# Patient Record
Sex: Female | Born: 1945 | Race: Black or African American | Hispanic: No | Marital: Married | Smoking: Former smoker
Health system: Southern US, Community
[De-identification: ages and names within clinical notes are randomized; demographics above are authoritative.]

## PROBLEM LIST (undated history)

## (undated) DIAGNOSIS — I10 Essential (primary) hypertension: Secondary | ICD-10-CM

## (undated) DIAGNOSIS — M1990 Unspecified osteoarthritis, unspecified site: Secondary | ICD-10-CM

## (undated) DIAGNOSIS — E785 Hyperlipidemia, unspecified: Secondary | ICD-10-CM

## (undated) DIAGNOSIS — C801 Malignant (primary) neoplasm, unspecified: Secondary | ICD-10-CM

## (undated) DIAGNOSIS — E079 Disorder of thyroid, unspecified: Secondary | ICD-10-CM

## (undated) HISTORY — DX: Hyperlipidemia, unspecified: E78.5

## (undated) HISTORY — DX: Disorder of thyroid, unspecified: E07.9

## (undated) HISTORY — DX: Malignant (primary) neoplasm, unspecified: C80.1

## (undated) HISTORY — DX: Essential (primary) hypertension: I10

---

## 1998-09-01 ENCOUNTER — Other Ambulatory Visit: Admission: RE | Admit: 1998-09-01 | Discharge: 1998-09-01 | Payer: Self-pay | Admitting: Family Medicine

## 1999-09-01 ENCOUNTER — Other Ambulatory Visit: Admission: RE | Admit: 1999-09-01 | Discharge: 1999-09-01 | Payer: Self-pay | Admitting: Family Medicine

## 2001-01-25 ENCOUNTER — Other Ambulatory Visit: Admission: RE | Admit: 2001-01-25 | Discharge: 2001-01-25 | Payer: Self-pay | Admitting: Family Medicine

## 2002-02-26 ENCOUNTER — Other Ambulatory Visit: Admission: RE | Admit: 2002-02-26 | Discharge: 2002-02-26 | Payer: Self-pay | Admitting: Family Medicine

## 2003-10-10 ENCOUNTER — Other Ambulatory Visit: Admission: RE | Admit: 2003-10-10 | Discharge: 2003-10-10 | Payer: Self-pay | Admitting: Family Medicine

## 2005-05-26 ENCOUNTER — Other Ambulatory Visit: Admission: RE | Admit: 2005-05-26 | Discharge: 2005-05-26 | Payer: Self-pay | Admitting: Family Medicine

## 2005-07-13 ENCOUNTER — Encounter (INDEPENDENT_AMBULATORY_CARE_PROVIDER_SITE_OTHER): Payer: Self-pay | Admitting: Diagnostic Radiology

## 2005-07-13 ENCOUNTER — Encounter (INDEPENDENT_AMBULATORY_CARE_PROVIDER_SITE_OTHER): Payer: Self-pay | Admitting: *Deleted

## 2005-07-13 ENCOUNTER — Encounter: Admission: RE | Admit: 2005-07-13 | Discharge: 2005-07-13 | Payer: Self-pay | Admitting: General Surgery

## 2005-07-25 ENCOUNTER — Encounter: Admission: RE | Admit: 2005-07-25 | Discharge: 2005-07-25 | Payer: Self-pay | Admitting: General Surgery

## 2005-07-26 ENCOUNTER — Ambulatory Visit (HOSPITAL_BASED_OUTPATIENT_CLINIC_OR_DEPARTMENT_OTHER): Admission: RE | Admit: 2005-07-26 | Discharge: 2005-07-26 | Payer: Self-pay | Admitting: General Surgery

## 2005-07-26 ENCOUNTER — Encounter: Admission: RE | Admit: 2005-07-26 | Discharge: 2005-07-26 | Payer: Self-pay | Admitting: General Surgery

## 2005-07-26 ENCOUNTER — Encounter (INDEPENDENT_AMBULATORY_CARE_PROVIDER_SITE_OTHER): Payer: Self-pay | Admitting: Specialist

## 2005-07-26 ENCOUNTER — Ambulatory Visit: Payer: Self-pay | Admitting: Oncology

## 2005-07-26 ENCOUNTER — Encounter (INDEPENDENT_AMBULATORY_CARE_PROVIDER_SITE_OTHER): Payer: Self-pay | Admitting: General Surgery

## 2005-08-24 LAB — CBC WITH DIFFERENTIAL/PLATELET
Basophils Absolute: 0 10*3/uL (ref 0.0–0.1)
EOS%: 1.5 % (ref 0.0–7.0)
Eosinophils Absolute: 0.1 10*3/uL (ref 0.0–0.5)
LYMPH%: 31 % (ref 14.0–48.0)
MCH: 31.4 pg (ref 26.0–34.0)
MCV: 94.6 fL (ref 81.0–101.0)
MONO%: 9 % (ref 0.0–13.0)
Platelets: 229 10*3/uL (ref 145–400)
RBC: 4.12 10*6/uL (ref 3.70–5.32)
RDW: 15.9 % — ABNORMAL HIGH (ref 11.3–14.5)

## 2005-08-24 LAB — COMPREHENSIVE METABOLIC PANEL
AST: 28 U/L (ref 0–37)
Albumin: 4.7 g/dL (ref 3.5–5.2)
Alkaline Phosphatase: 78 U/L (ref 39–117)
BUN: 9 mg/dL (ref 6–23)
Glucose, Bld: 97 mg/dL (ref 70–99)
Potassium: 4.2 mEq/L (ref 3.5–5.3)
Sodium: 140 mEq/L (ref 135–145)
Total Bilirubin: 0.4 mg/dL (ref 0.3–1.2)

## 2005-08-24 LAB — LACTATE DEHYDROGENASE: LDH: 160 U/L (ref 94–250)

## 2005-09-12 ENCOUNTER — Ambulatory Visit: Admission: RE | Admit: 2005-09-12 | Discharge: 2005-09-26 | Payer: Self-pay | Admitting: Radiation Oncology

## 2005-10-05 ENCOUNTER — Ambulatory Visit: Payer: Self-pay | Admitting: Oncology

## 2006-06-20 ENCOUNTER — Other Ambulatory Visit: Admission: RE | Admit: 2006-06-20 | Discharge: 2006-06-20 | Payer: Self-pay | Admitting: Family Medicine

## 2009-01-06 ENCOUNTER — Encounter: Admission: RE | Admit: 2009-01-06 | Discharge: 2009-01-06 | Payer: Self-pay | Admitting: Surgery

## 2009-01-08 ENCOUNTER — Encounter: Admission: RE | Admit: 2009-01-08 | Discharge: 2009-01-08 | Payer: Self-pay | Admitting: Surgery

## 2009-01-08 ENCOUNTER — Ambulatory Visit (HOSPITAL_BASED_OUTPATIENT_CLINIC_OR_DEPARTMENT_OTHER): Admission: RE | Admit: 2009-01-08 | Discharge: 2009-01-08 | Payer: Self-pay | Admitting: Surgery

## 2009-01-08 ENCOUNTER — Encounter (INDEPENDENT_AMBULATORY_CARE_PROVIDER_SITE_OTHER): Payer: Self-pay | Admitting: Surgery

## 2010-01-14 DIAGNOSIS — C801 Malignant (primary) neoplasm, unspecified: Secondary | ICD-10-CM

## 2010-01-14 HISTORY — DX: Malignant (primary) neoplasm, unspecified: C80.1

## 2010-02-03 ENCOUNTER — Ambulatory Visit: Payer: Self-pay | Admitting: Oncology

## 2010-02-10 ENCOUNTER — Encounter: Admission: RE | Admit: 2010-02-10 | Discharge: 2010-02-10 | Payer: Self-pay | Admitting: Surgery

## 2010-02-10 ENCOUNTER — Ambulatory Visit: Admission: RE | Admit: 2010-02-10 | Discharge: 2010-03-02 | Payer: Self-pay | Admitting: Radiation Oncology

## 2010-02-10 LAB — CBC WITH DIFFERENTIAL/PLATELET
Eosinophils Absolute: 0.1 10*3/uL (ref 0.0–0.5)
HCT: 37.9 % (ref 34.8–46.6)
HGB: 12.7 g/dL (ref 11.6–15.9)
LYMPH%: 36.4 % (ref 14.0–49.7)
MONO#: 0.4 10*3/uL (ref 0.1–0.9)
NEUT#: 2.8 10*3/uL (ref 1.5–6.5)
NEUT%: 52.2 % (ref 38.4–76.8)
Platelets: 237 10*3/uL (ref 145–400)
WBC: 5.4 10*3/uL (ref 3.9–10.3)

## 2010-02-10 LAB — COMPREHENSIVE METABOLIC PANEL
ALT: 10 U/L (ref 0–35)
Albumin: 4.2 g/dL (ref 3.5–5.2)
BUN: 5 mg/dL — ABNORMAL LOW (ref 6–23)
CO2: 23 mEq/L (ref 19–32)
Calcium: 9.7 mg/dL (ref 8.4–10.5)
Chloride: 108 mEq/L (ref 96–112)
Creatinine, Ser: 0.72 mg/dL (ref 0.40–1.20)
Potassium: 4.3 mEq/L (ref 3.5–5.3)

## 2010-03-16 ENCOUNTER — Ambulatory Visit (HOSPITAL_COMMUNITY): Admission: RE | Admit: 2010-03-16 | Discharge: 2010-03-16 | Payer: Self-pay | Admitting: Surgery

## 2010-03-16 ENCOUNTER — Encounter: Admission: RE | Admit: 2010-03-16 | Discharge: 2010-03-16 | Payer: Self-pay | Admitting: Surgery

## 2010-03-22 ENCOUNTER — Encounter (INDEPENDENT_AMBULATORY_CARE_PROVIDER_SITE_OTHER): Payer: Self-pay | Admitting: Surgery

## 2010-03-22 ENCOUNTER — Ambulatory Visit (HOSPITAL_COMMUNITY): Admission: RE | Admit: 2010-03-22 | Discharge: 2010-03-23 | Payer: Self-pay | Admitting: Surgery

## 2010-03-22 HISTORY — PX: MASTECTOMY, PARTIAL: SHX709

## 2010-06-05 ENCOUNTER — Encounter: Payer: Self-pay | Admitting: Surgery

## 2010-07-28 LAB — SURGICAL PCR SCREEN: MRSA, PCR: NEGATIVE

## 2010-07-28 LAB — CBC
MCV: 86.3 fL (ref 78.0–100.0)
Platelets: 215 10*3/uL (ref 150–400)
RBC: 4.52 MIL/uL (ref 3.87–5.11)
WBC: 5.4 10*3/uL (ref 4.0–10.5)

## 2010-07-28 LAB — BASIC METABOLIC PANEL
Chloride: 110 mEq/L (ref 96–112)
Creatinine, Ser: 0.71 mg/dL (ref 0.4–1.2)
GFR calc Af Amer: >60 mL/min (ref >60)
GFR calc non Af Amer: >60 mL/min (ref >60)
Potassium: 4.6 mEq/L (ref 3.5–5.1)

## 2010-08-21 LAB — DIFFERENTIAL
Lymphocytes Relative: 22 % (ref 12–46)
Lymphs Abs: 1.7 10*3/uL (ref 0.7–4.0)
Monocytes Absolute: 0.5 10*3/uL (ref 0.1–1.0)
Monocytes Relative: 7 % (ref 3–12)
Neutro Abs: 5.2 10*3/uL (ref 1.7–7.7)
Neutrophils Relative %: 69 % (ref 43–77)

## 2010-08-21 LAB — BASIC METABOLIC PANEL
CO2: 27 mEq/L (ref 19–32)
Calcium: 10.2 mg/dL (ref 8.4–10.5)
Creatinine, Ser: 0.53 mg/dL (ref 0.4–1.2)
GFR calc Af Amer: >60 mL/min (ref >60)
GFR calc non Af Amer: >60 mL/min (ref >60)
Sodium: 144 mEq/L (ref 135–145)

## 2010-08-21 LAB — CBC
Hemoglobin: 14.1 g/dL (ref 12.0–15.0)
RBC: 4.2 MIL/uL (ref 3.87–5.11)
WBC: 7.6 10*3/uL (ref 4.0–10.5)

## 2010-09-28 NOTE — Op Note (Signed)
Paula, Alvarez NO.:  [id number]   MEDICAL RECORD NO.:  [id number]          PATIENT TYPE:  AMB   LOCATION:  DSC                          FACILITY:  [facility]   PHYSICIAN:  Thomas A. Cornett, M.D.DATE OF BIRTH:  Oct 14, 1945   DATE OF PROCEDURE:  01/08/2009  DATE OF DISCHARGE:                               OPERATIVE REPORT   PREOPERATIVE DIAGNOSIS:  Left breast cancer stage I.   POSTOPERATIVE DIAGNOSIS:  Left breast cancer stage I.   PROCEDURE:  Left breast needle localized partial mastectomy.   SURGEON:  Maisie Fus A. Cornett, MD   ANESTHESIA:  Local.  Approximately 40 mL of 0.25% Sensorcaine with  epinephrine was used.   ESTIMATED BLOOD LOSS:  5 mL.   SPECIMEN:  Left breast tissue with localizing wire and clip to  pathology, verified by radiograph.   INDICATIONS FOR PROCEDURE:  The patient is a 65 year old female with  recurrent left breast cancer.  She was treated with lumpectomy with  sentinel lymph node mapping about 2 years ago and has recurred.  I  talked with  her about repeat lumpectomy and postop radiation therapy  with potential sentinel lymph node mapping versus axillary node  dissection.  She did not wish any further therapy at the time of her  initial cancer and refused any further chemotherapy, radiation therapy  at that time.  I explained to her that her risk of recurrence was high  without proper therapy, but she was adamant about only having lumpectomy  done and those were her wishes after lengthy discussion.  I talked with  her about this and she was very adamant, did not wish any further  therapy except lumpectomy.  She presents for that today.   DESCRIPTION OF PROCEDURE:  After undergoing wire localization, the  patient was taken back to the operating room.  Unfortunately, the  patient has not been drink about 4 hours prior to the procedure;  therefore, a straight local was used.  The left breast was then prepped  and draped in sterile  fashion.  Wire was trimmed from the needle  localization into her left breast.  I used about 40 mL of 0.25%  Sensorcaine, injected it all around the mass.  An elliptical incision  was used in the left breast upper outer quadrant.  Dissection was  carried down around the wire.  The tumor was easily palpable, was in the  lateral portion of her previous lumpectomy cavity looked like.  I  excised this with cautery.  I excised this with scalpel.  The entire  tumor was removed.  I took an additional medial margin.  The cavity was  hemostatic after irrigation.  It was closed at deep layer of 3-0 Vicryl  and subsequent 4-0 Monocryl  stitch.  Dermabond was applied.  All final counts of sponge, needle, and  instruments were found to be correct for this portion of the case.  The  patient was awoke, taken to recovery in satisfactory condition.  All  final counts were correct.      Thomas A. Cornett, M.D.  Electronically Signed  TAC/MEDQ  D:  01/08/2009  T:  01/09/2009  Job:  [id number]   cc:   Dr. Bennie Pierini

## 2010-10-01 NOTE — Op Note (Signed)
NAMEHINLEY, BRIMAGE NO.:  [id number]   MEDICAL RECORD NO.:  [id number]          PATIENT TYPE:  AMB   LOCATION:  DSC                          FACILITY:  [facility]   PHYSICIAN:  Rose Phi. Maple Hudson, M.D.   DATE OF BIRTH:  [birth year]   DATE OF PROCEDURE:  07/26/2005  DATE OF DISCHARGE:                                 OPERATIVE REPORT   PREOPERATIVE DIAGNOSIS:  Stage I carcinoma of the left breast.   POSTOPERATIVE DIAGNOSIS:  Stage I carcinoma of the left breast.   OPERATION PERFORMED:  Blue dye injection, left partial mastectomy with  needle localization and specimen mammogram and left axillary sentinel lymph  node biopsy.   SURGEON:  Rose Phi. Maple Hudson, M.D.   ANESTHESIA:  General.   DESCRIPTION OF PROCEDURE:  Prior to coming to the operating room, a  localizing wire had been placed in the left breast for the lesion that was  at about the 12 o'clock position of the left breast.  In addition 1 mCi of  technetium sulfur colloid was injected intradermally.   After suitable general anesthesia was induced, the patient was placed in  supine position with the arms extended on the arm board.  5 mL of a mixture  of 2 mL of methylene blue and 3 mL of injectable saline was injected in the  subareolar breast tissue and the breast gently massaged for three minutes.  We then prepped and draped the breast and axilla.   A curved incision was then outlined for the lesion at the near 12 o'clock  position of the left breast using the previously placed wire as a guide.  A  curved incision was then made and a wide incision of the wire and  surrounding tissue was carried out.  The specimen was oriented for the  pathologist.  Specimen mammogram confirmed the removal of the lesion with  good margins around it and it was submitted to the pathologist for Touch  Prep evaluation of the margins.   While that was being done, a short transverse left axillary incision was  made with dissection  through the subcutaneous tissue to the clavipectoral  fascia.  Deep to the fascia, we found two blue and hot lymph nodes which  were removed and submitted to the pathologist for evaluation.  There were no  other blue, palpable or hot nodes.   The margins were reported as clean and the sentinel node turned out to be  negative.  Both incisions were thoroughly infiltrated with local anesthetic  mixture.  The lumpectomy incision was closed with deeper sutures to  obliterate the space and then close the skin with subcuticular 4-0 Monocryl  and Steri-Strips.  This gave an excellent cosmetic  appearance.  The sentinel node incision was closed with 3-0 Vicryl and  subcuticular 4-0 Monocryl and Steri-Strips.  Dressings were applied.  The  patient was then transferred to the recovery room in satisfactory condition  having tolerated the procedure well.      Rose Phi. Maple Hudson, M.D.  Electronically Signed     PRY/MEDQ  D:  07/26/2005  T:  07/27/2005  Job:  [id number]

## 2010-10-22 ENCOUNTER — Other Ambulatory Visit (INDEPENDENT_AMBULATORY_CARE_PROVIDER_SITE_OTHER): Payer: Self-pay | Admitting: Surgery

## 2010-10-22 DIAGNOSIS — R222 Localized swelling, mass and lump, trunk: Secondary | ICD-10-CM

## 2010-10-22 DIAGNOSIS — N6489 Other specified disorders of breast: Secondary | ICD-10-CM

## 2010-10-22 DIAGNOSIS — Z9012 Acquired absence of left breast and nipple: Secondary | ICD-10-CM

## 2010-11-03 ENCOUNTER — Other Ambulatory Visit: Payer: Self-pay

## 2010-11-03 ENCOUNTER — Ambulatory Visit
Admission: RE | Admit: 2010-11-03 | Discharge: 2010-11-03 | Disposition: A | Discharge status: Home / Self Care | Payer: Medicare Other | Source: Ambulatory Visit | Attending: Surgery | Admitting: Surgery

## 2010-11-03 DIAGNOSIS — N6489 Other specified disorders of breast: Secondary | ICD-10-CM

## 2010-11-03 DIAGNOSIS — R222 Localized swelling, mass and lump, trunk: Secondary | ICD-10-CM

## 2010-11-03 DIAGNOSIS — Z9012 Acquired absence of left breast and nipple: Secondary | ICD-10-CM

## 2010-11-03 MED ORDER — GADOBENATE DIMEGLUMINE 529 MG/ML IV SOLN
13.0000 mL | Freq: Once | INTRAVENOUS | Status: AC | PRN
  Administered 2010-11-03: 13 mL via INTRAVENOUS

## 2010-11-19 ENCOUNTER — Encounter (INDEPENDENT_AMBULATORY_CARE_PROVIDER_SITE_OTHER): Payer: Medicare Other | Admitting: Surgery

## 2011-03-14 ENCOUNTER — Ambulatory Visit (INDEPENDENT_AMBULATORY_CARE_PROVIDER_SITE_OTHER): Payer: Medicare Other | Admitting: Surgery

## 2011-03-25 ENCOUNTER — Encounter (INDEPENDENT_AMBULATORY_CARE_PROVIDER_SITE_OTHER): Payer: Self-pay | Admitting: Surgery

## 2011-03-25 ENCOUNTER — Ambulatory Visit (INDEPENDENT_AMBULATORY_CARE_PROVIDER_SITE_OTHER): Payer: Medicare Other | Admitting: Surgery

## 2011-03-25 VITALS — BP 134/72 | HR 56 | Temp 97.4°F | Resp 12 | Ht 63.5 in | Wt 145.2 lb

## 2011-03-25 DIAGNOSIS — Z853 Personal history of malignant neoplasm of breast: Secondary | ICD-10-CM

## 2011-03-25 NOTE — Patient Instructions (Signed)
Follow up in 1 year.  Keep follow up with doctors.

## 2011-03-25 NOTE — Progress Notes (Signed)
Subjective:     Patient ID: Paula Alvarez, female   DOB: 04-Apr-1946, 65 y.o.   MRN: [id number]  HPI The patient returns today in followup. She is doing well. She is one year out from a left simple mastectomy for recurrent left breast cancer. Her initial cancer diagnosis was in 2008 this was stage I. She underwent a lumpectomy with sentinel lymph node mapping. She developed recurrence in 2010 and underwent lumpectomy. Her margins were positive but she refused reexcision or any further treatment. Her cancer recurred and she consented for left total mastectomy in 2011. She is doing well   Review of Systems  Constitutional: Negative.   HENT: Negative.   Eyes: Negative.   Respiratory: Negative.        Objective:   Physical Exam  Constitutional: She appears well-developed and well-nourished.  HENT:  Head: Normocephalic and atraumatic.  Eyes: EOM are normal. Pupils are equal, round, and reactive to light.  Neck: Normal range of motion. Neck supple.  Pulmonary/Chest:       Left breast surgically absent. No masses along the chest wall. Left axilla normal. Right breast normal. Right axilla normal.       Assessment:     History of stage I recurrent left breast cancer status post left simple mastectomy November 2011 with no evidence of recurrence.    Plan:     She has been followed in [place] for this. She is doing well I will see her back in one year. Mammogram in June of 2012 of right breast shows no abnormality.

## 2011-10-19 ENCOUNTER — Other Ambulatory Visit (INDEPENDENT_AMBULATORY_CARE_PROVIDER_SITE_OTHER): Payer: Self-pay | Admitting: Surgery

## 2011-10-19 DIAGNOSIS — Z9012 Acquired absence of left breast and nipple: Secondary | ICD-10-CM

## 2011-10-19 DIAGNOSIS — Z1231 Encounter for screening mammogram for malignant neoplasm of breast: Secondary | ICD-10-CM

## 2011-11-14 ENCOUNTER — Ambulatory Visit
Admission: RE | Admit: 2011-11-14 | Discharge: 2011-11-14 | Disposition: A | Discharge status: Home / Self Care | Payer: Medicare Other | Source: Ambulatory Visit | Attending: Surgery | Admitting: Surgery

## 2011-11-14 DIAGNOSIS — Z1231 Encounter for screening mammogram for malignant neoplasm of breast: Secondary | ICD-10-CM

## 2011-11-14 DIAGNOSIS — Z9012 Acquired absence of left breast and nipple: Secondary | ICD-10-CM

## 2012-03-23 ENCOUNTER — Ambulatory Visit (INDEPENDENT_AMBULATORY_CARE_PROVIDER_SITE_OTHER): Payer: Medicare Other | Admitting: Surgery

## 2012-03-23 ENCOUNTER — Encounter (INDEPENDENT_AMBULATORY_CARE_PROVIDER_SITE_OTHER): Payer: Self-pay | Admitting: Surgery

## 2012-03-23 VITALS — BP 118/60 | HR 60 | Temp 97.3°F | Resp 16 | Ht 63.5 in | Wt 145.2 lb

## 2012-03-23 DIAGNOSIS — Z853 Personal history of malignant neoplasm of breast: Secondary | ICD-10-CM

## 2012-03-23 NOTE — Patient Instructions (Signed)
Return 1 year. 

## 2012-03-23 NOTE — Progress Notes (Signed)
Subjective:     Patient ID: Paula Alvarez, female   DOB: 10-18-1945, 66 y.o.   MRN: [id number]  HPI The patient returns today in followup. She is doing well. She is one year out from a left simple mastectomy for recurrent left breast cancer. Her initial cancer diagnosis was in 2008 this was stage I. She underwent a lumpectomy with sentinel lymph node mapping. She developed recurrence in 2010 and underwent lumpectomy. Her margins were positive but she refused reexcision or any further treatment. Her cancer recurred and she consented for left total mastectomy in 2011. She is doing well   Review of Systems  Constitutional: Negative.   HENT: Negative.   Eyes: Negative.   Respiratory: Negative.        Objective:   Physical Exam  Constitutional: She appears well-developed and well-nourished.  HENT:  Head: Normocephalic and atraumatic.  Eyes: EOM are normal. Pupils are equal, round, and reactive to light.  Neck: Normal range of motion. Neck supple.  Pulmonary/Chest:       Left breast surgically absent. No masses along the chest wall. Left axilla normal. Right breast normal. Right axilla normal.       Assessment:     History of stage I recurrent left breast cancer status post left simple mastectomy November 2011 with no evidence of recurrence.    Plan:     She has been followed in [place] for this. She is doing well I will see her back in one year. Mammogram in July  Of 2013  of right breast shows no abnormality.

## 2012-03-26 ENCOUNTER — Ambulatory Visit (INDEPENDENT_AMBULATORY_CARE_PROVIDER_SITE_OTHER): Payer: Medicare Other | Admitting: Surgery

## 2012-09-14 ENCOUNTER — Other Ambulatory Visit: Payer: Self-pay | Admitting: *Deleted

## 2012-09-14 MED ORDER — METOPROLOL TARTRATE 25 MG PO TABS: 25.0000 mg | ORAL_TABLET | Freq: Two times a day (BID) | ORAL | Status: DC

## 2012-09-15 ENCOUNTER — Other Ambulatory Visit: Payer: Self-pay | Admitting: Family Medicine

## 2012-10-01 ENCOUNTER — Other Ambulatory Visit: Payer: Self-pay

## 2012-10-01 MED ORDER — AMLODIPINE BESY-BENAZEPRIL HCL 5-40 MG PO CAPS: 1.0000 | ORAL_CAPSULE | Freq: Every day | ORAL | Status: DC

## 2012-10-02 ENCOUNTER — Other Ambulatory Visit (INDEPENDENT_AMBULATORY_CARE_PROVIDER_SITE_OTHER): Payer: Medicare Other

## 2012-10-02 DIAGNOSIS — E785 Hyperlipidemia, unspecified: Secondary | ICD-10-CM

## 2012-10-02 DIAGNOSIS — I10 Essential (primary) hypertension: Secondary | ICD-10-CM

## 2012-10-02 LAB — COMPLETE METABOLIC PANEL WITH GFR
AST: 27 U/L (ref 0–37)
Albumin: 4.5 g/dL (ref 3.5–5.2)
Alkaline Phosphatase: 69 U/L (ref 39–117)
Potassium: 4.7 mEq/L (ref 3.5–5.3)
Sodium: 138 mEq/L (ref 135–145)
Total Protein: 7.5 g/dL (ref 6.0–8.3)

## 2012-10-02 NOTE — Progress Notes (Signed)
Patient here today for labs only. °

## 2012-10-05 LAB — NMR LIPOPROFILE WITH LIPIDS
HDL Size: 9.8 nm (ref >=9.2)
HDL-C: 111 mg/dL (ref >=40)
LDL Particle Number: 970 nmol/L (ref <1000)
LP-IR Score: 31 (ref <=45)
Triglycerides: 91 mg/dL (ref <150)
VLDL Size: 56 nm — ABNORMAL HIGH (ref <=46.6)

## 2012-10-15 ENCOUNTER — Other Ambulatory Visit: Payer: Self-pay | Admitting: Nurse Practitioner

## 2012-10-17 ENCOUNTER — Other Ambulatory Visit: Payer: Self-pay

## 2012-10-17 MED ORDER — LEVOTHYROXINE SODIUM 25 MCG PO TABS: 25.0000 ug | ORAL_TABLET | Freq: Every day | ORAL | Status: DC

## 2012-12-05 ENCOUNTER — Telehealth: Payer: Self-pay | Admitting: Nurse Practitioner

## 2012-12-07 ENCOUNTER — Other Ambulatory Visit: Payer: Self-pay | Admitting: *Deleted

## 2012-12-07 MED ORDER — AMLODIPINE BESY-BENAZEPRIL HCL 5-40 MG PO CAPS: 1.0000 | ORAL_CAPSULE | Freq: Every day | ORAL | Status: DC

## 2012-12-13 NOTE — Telephone Encounter (Signed)
error 

## 2012-12-19 ENCOUNTER — Other Ambulatory Visit: Payer: Self-pay

## 2013-01-01 ENCOUNTER — Other Ambulatory Visit: Payer: Self-pay

## 2013-01-01 MED ORDER — METOPROLOL TARTRATE 25 MG PO TABS: 25.0000 mg | ORAL_TABLET | Freq: Two times a day (BID) | ORAL | Status: DC

## 2013-01-01 NOTE — Telephone Encounter (Signed)
Last seen 06/21/12  MMM

## 2013-01-15 ENCOUNTER — Other Ambulatory Visit: Payer: Self-pay

## 2013-01-15 MED ORDER — ATORVASTATIN CALCIUM 20 MG PO TABS: 20.0000 mg | ORAL_TABLET | Freq: Every day | ORAL | Status: DC

## 2013-02-07 ENCOUNTER — Other Ambulatory Visit: Payer: Self-pay

## 2013-02-07 MED ORDER — METOPROLOL TARTRATE 25 MG PO TABS: 25.0000 mg | ORAL_TABLET | Freq: Two times a day (BID) | ORAL | Status: DC

## 2013-02-07 NOTE — Telephone Encounter (Signed)
last seen 06/21/12  MMM

## 2013-03-06 ENCOUNTER — Encounter: Payer: Self-pay | Admitting: *Deleted

## 2013-03-11 ENCOUNTER — Other Ambulatory Visit: Payer: Self-pay

## 2013-03-13 ENCOUNTER — Other Ambulatory Visit: Payer: Self-pay | Admitting: *Deleted

## 2013-03-13 MED ORDER — AMLODIPINE BESY-BENAZEPRIL HCL 5-40 MG PO CAPS: 1.0000 | ORAL_CAPSULE | Freq: Every day | ORAL | Status: DC

## 2013-03-13 MED ORDER — METOPROLOL TARTRATE 25 MG PO TABS: 25.0000 mg | ORAL_TABLET | Freq: Two times a day (BID) | ORAL | Status: DC

## 2013-03-18 ENCOUNTER — Encounter (INDEPENDENT_AMBULATORY_CARE_PROVIDER_SITE_OTHER): Payer: Self-pay | Admitting: Surgery

## 2013-03-18 ENCOUNTER — Ambulatory Visit (INDEPENDENT_AMBULATORY_CARE_PROVIDER_SITE_OTHER): Payer: Medicare Other | Admitting: Surgery

## 2013-03-18 VITALS — BP 124/66 | HR 68 | Temp 97.6°F | Resp 14 | Ht 63.5 in | Wt 139.6 lb

## 2013-03-18 DIAGNOSIS — Z853 Personal history of malignant neoplasm of breast: Secondary | ICD-10-CM

## 2013-03-18 NOTE — Patient Instructions (Signed)
Return 1 year. 

## 2013-03-18 NOTE — Progress Notes (Signed)
Subjective:     Patient ID: Paula Alvarez, female   DOB: Jun 13, 1945, 67 y.o.   MRN: [id number]  HPI The patient returns today in followup. She is doing well. She is one year out from a left simple mastectomy for recurrent left breast cancer. Her initial cancer diagnosis was in 2008 this was stage I. She underwent a lumpectomy with sentinel lymph node mapping. She developed recurrence in 2010 and underwent lumpectomy. Her margins were positive but she refused reexcision or any further treatment. Her cancer recurred and she consented for left total mastectomy in 2011. She is doing well   Review of Systems  Constitutional: Negative.   HENT: Negative.   Eyes: Negative.   Respiratory: Negative.        Objective:   Physical Exam  Constitutional: She appears well-developed and well-nourished.  HENT:  Head: Normocephalic and atraumatic.  Eyes: EOM are normal. Pupils are equal, round, and reactive to light.  Neck: Normal range of motion. Neck supple.  Pulmonary/Chest:       Left breast surgically absent. No masses along the chest wall. Left axilla normal. Right breast normal. Right axilla normal.  Mammogram 01/2013 no abnormality.      Assessment:     History of stage I recurrent left breast cancer status post left simple mastectomy November 2011 with no evidence of recurrence.    Plan:     She has been followed in [place] for this. She is doing well I will see her back in one year.

## 2013-03-21 ENCOUNTER — Other Ambulatory Visit: Payer: Self-pay

## 2013-03-29 ENCOUNTER — Encounter: Payer: Self-pay | Admitting: Nurse Practitioner

## 2013-03-29 ENCOUNTER — Ambulatory Visit (INDEPENDENT_AMBULATORY_CARE_PROVIDER_SITE_OTHER): Payer: Medicare Other | Admitting: Nurse Practitioner

## 2013-03-29 VITALS — BP 145/73 | HR 55 | Temp 97.4°F | Ht 63.5 in | Wt 137.0 lb

## 2013-03-29 DIAGNOSIS — I10 Essential (primary) hypertension: Secondary | ICD-10-CM | POA: Insufficient documentation

## 2013-03-29 DIAGNOSIS — E039 Hypothyroidism, unspecified: Secondary | ICD-10-CM | POA: Insufficient documentation

## 2013-03-29 DIAGNOSIS — E785 Hyperlipidemia, unspecified: Secondary | ICD-10-CM

## 2013-03-29 DIAGNOSIS — Z23 Encounter for immunization: Secondary | ICD-10-CM

## 2013-03-29 MED ORDER — AMLODIPINE BESY-BENAZEPRIL HCL 5-40 MG PO CAPS: 1.0000 | ORAL_CAPSULE | Freq: Every day | ORAL | Status: DC

## 2013-03-29 MED ORDER — METOPROLOL TARTRATE 25 MG PO TABS: 25.0000 mg | ORAL_TABLET | Freq: Two times a day (BID) | ORAL | Status: DC

## 2013-03-29 MED ORDER — LEVOTHYROXINE SODIUM 25 MCG PO TABS: 25.0000 ug | ORAL_TABLET | Freq: Every day | ORAL | Status: DC

## 2013-03-29 MED ORDER — ATORVASTATIN CALCIUM 20 MG PO TABS: 20.0000 mg | ORAL_TABLET | Freq: Every day | ORAL | Status: DC

## 2013-03-29 NOTE — Progress Notes (Signed)
  Subjective:    Patient ID: Paula Alvarez, female    DOB: 1945/11/06, 67 y.o.   MRN: [id number]  Hypertension This is a chronic problem. The current episode started more than 1 year ago. The problem is unchanged. The problem is controlled. Pertinent negatives include no blurred vision, chest pain, headaches, neck pain, palpitations, peripheral edema or shortness of breath. Risk factors for coronary artery disease include dyslipidemia, family history and post-menopausal state. Past treatments include ACE inhibitors, calcium channel blockers and beta blockers. The current treatment provides moderate improvement. There are no compliance problems.  Hypertensive end-organ damage includes a thyroid problem.  Hyperlipidemia This is a chronic problem. The current episode started more than 1 year ago. The problem is uncontrolled. Recent lipid tests were reviewed and are high. Exacerbating diseases include hypothyroidism. She has no history of diabetes or obesity. Factors aggravating her hyperlipidemia include beta blockers. Pertinent negatives include no chest pain or shortness of breath. Current antihyperlipidemic treatment includes statins. The current treatment provides moderate improvement of lipids. Compliance problems include adherence to diet.  Risk factors for coronary artery disease include hypertension and post-menopausal.  Thyroid Problem Visit type: hypothyroidism. Patient reports no anxiety, constipation, diarrhea, dry skin, fatigue, hair loss, heat intolerance, leg swelling, nail problem, palpitations, visual change, weight gain or weight loss. The symptoms have been stable. Her past medical history is significant for hyperlipidemia. There is no history of diabetes.      Review of Systems  Constitutional: Negative for weight loss, weight gain and fatigue.  Eyes: Negative for blurred vision.  Respiratory: Negative for shortness of breath.   Cardiovascular: Negative for chest pain and  palpitations.  Gastrointestinal: Negative for diarrhea and constipation.  Endocrine: Negative for heat intolerance.  Musculoskeletal: Negative for neck pain.  Neurological: Negative for headaches.  All other systems reviewed and are negative.       Objective:   Physical Exam  Constitutional: She is oriented to person, place, and time. She appears well-developed and well-nourished.  HENT:  Nose: Nose normal.  Mouth/Throat: Oropharynx is clear and moist.  Eyes: EOM are normal.  Neck: Trachea normal, normal range of motion and full passive range of motion without pain. Neck supple. No JVD present. Carotid bruit is not present. No thyromegaly present.  Cardiovascular: Normal rate, regular rhythm, normal heart sounds and intact distal pulses.  Exam reveals no gallop and no friction rub.   No murmur heard. Pulmonary/Chest: Effort normal and breath sounds normal.  Abdominal: Soft. Bowel sounds are normal. She exhibits no distension and no mass. There is no tenderness.  Musculoskeletal: Normal range of motion.  Lymphadenopathy:    She has no cervical adenopathy.  Neurological: She is alert and oriented to person, place, and time. She has normal reflexes.  Skin: Skin is warm and dry.  Psychiatric: She has a normal mood and affect. Her behavior is normal. Judgment and thought content normal.          Assessment & Plan:

## 2013-03-29 NOTE — Patient Instructions (Signed)

## 2013-03-30 LAB — CMP14+EGFR
ALT: 17 IU/L (ref 0–32)
Albumin/Globulin Ratio: 1.9 (ref 1.1–2.5)
Albumin: 4.8 g/dL (ref 3.6–4.8)
BUN/Creatinine Ratio: 8 — ABNORMAL LOW (ref 11–26)
CO2: 24 mmol/L (ref 18–29)
Creatinine, Ser: 0.65 mg/dL (ref 0.57–1.00)
GFR calc Af Amer: 106 mL/min/{1.73_m2} (ref >59)
GFR calc non Af Amer: 92 mL/min/{1.73_m2} (ref >59)
Potassium: 4.3 mmol/L (ref 3.5–5.2)
Sodium: 139 mmol/L (ref 134–144)
Total Protein: 7.3 g/dL (ref 6.0–8.5)

## 2013-03-30 LAB — NMR, LIPOPROFILE
Cholesterol: 205 mg/dL — ABNORMAL HIGH (ref <200)
LDL Particle Number: 760 nmol/L (ref <1000)
LDL Size: 20.9 nm (ref >20.5)
LDLC SERPL CALC-MCNC: 74 mg/dL (ref <100)
Small LDL Particle Number: 370 nmol/L (ref <=527)
Triglycerides by NMR: 130 mg/dL (ref <150)

## 2013-03-30 LAB — THYROID PANEL WITH TSH
Free Thyroxine Index: 1.7 (ref 1.2–4.9)
TSH: 2.24 u[IU]/mL (ref 0.450–4.500)

## 2013-10-11 ENCOUNTER — Other Ambulatory Visit: Payer: Self-pay | Admitting: Nurse Practitioner

## 2013-11-11 ENCOUNTER — Encounter: Payer: Self-pay | Admitting: Nurse Practitioner

## 2013-11-11 ENCOUNTER — Ambulatory Visit (INDEPENDENT_AMBULATORY_CARE_PROVIDER_SITE_OTHER): Payer: Medicare Other | Admitting: Nurse Practitioner

## 2013-11-11 VITALS — BP 130/74 | HR 57 | Temp 98.6°F | Ht 63.5 in | Wt 137.0 lb

## 2013-11-11 DIAGNOSIS — I10 Essential (primary) hypertension: Secondary | ICD-10-CM

## 2013-11-11 DIAGNOSIS — E038 Other specified hypothyroidism: Secondary | ICD-10-CM

## 2013-11-11 DIAGNOSIS — C50919 Malignant neoplasm of unspecified site of unspecified female breast: Secondary | ICD-10-CM

## 2013-11-11 DIAGNOSIS — Z13820 Encounter for screening for osteoporosis: Secondary | ICD-10-CM

## 2013-11-11 DIAGNOSIS — C50912 Malignant neoplasm of unspecified site of left female breast: Secondary | ICD-10-CM

## 2013-11-11 DIAGNOSIS — E785 Hyperlipidemia, unspecified: Secondary | ICD-10-CM

## 2013-11-11 MED ORDER — ATORVASTATIN CALCIUM 20 MG PO TABS: 20.0000 mg | ORAL_TABLET | Freq: Every day | ORAL | Status: DC

## 2013-11-11 MED ORDER — AMLODIPINE BESY-BENAZEPRIL HCL 5-40 MG PO CAPS: ORAL_CAPSULE | ORAL | Status: DC

## 2013-11-11 MED ORDER — LEVOTHYROXINE SODIUM 25 MCG PO TABS: 25.0000 ug | ORAL_TABLET | Freq: Every day | ORAL | Status: DC

## 2013-11-11 MED ORDER — METOPROLOL TARTRATE 25 MG PO TABS: 25.0000 mg | ORAL_TABLET | Freq: Two times a day (BID) | ORAL | Status: DC

## 2013-11-11 NOTE — Patient Instructions (Signed)

## 2013-11-11 NOTE — Progress Notes (Signed)
Subjective:    Patient ID: Paula Alvarez, female    DOB: [birth year], 68 y.o.   MRN: [id number]  Patient here today for follow up of chronic medical problems.  Hypertension This is a chronic problem. The current episode started more than 1 year ago. The problem is unchanged. The problem is controlled. Pertinent negatives include no blurred vision, chest pain, headaches, neck pain, palpitations, peripheral edema or shortness of breath. Risk factors for coronary artery disease include dyslipidemia, family history and post-menopausal state. Past treatments include ACE inhibitors, calcium channel blockers and beta blockers. The current treatment provides moderate improvement. There are no compliance problems.  Hypertensive end-organ damage includes a thyroid problem.  Hyperlipidemia This is a chronic problem. The current episode started more than 1 year ago. The problem is uncontrolled. Recent lipid tests were reviewed and are high. Exacerbating diseases include hypothyroidism. She has no history of diabetes or obesity. Factors aggravating her hyperlipidemia include beta blockers. Pertinent negatives include no chest pain or shortness of breath. Current antihyperlipidemic treatment includes statins. The current treatment provides moderate improvement of lipids. Compliance problems include adherence to diet.  Risk factors for coronary artery disease include hypertension and post-menopausal.  Thyroid Problem Visit type: hypothyroidism. Patient reports no anxiety, constipation, diarrhea, dry skin, fatigue, hair loss, heat intolerance, leg swelling, nail problem, palpitations, visual change, weight gain or weight loss. The symptoms have been stable. Her past medical history is significant for hyperlipidemia. There is no history of diabetes.      Review of Systems  Constitutional: Negative for weight loss, weight gain and fatigue.  Eyes: Negative for blurred vision.  Respiratory: Negative for shortness of  breath.   Cardiovascular: Negative for chest pain and palpitations.  Gastrointestinal: Negative for diarrhea and constipation.  Endocrine: Negative for heat intolerance.  Musculoskeletal: Negative for neck pain.  Neurological: Negative for headaches.  All other systems reviewed and are negative.      Objective:   Physical Exam  Constitutional: She is oriented to person, place, and time. She appears well-developed and well-nourished.  HENT:  Nose: Nose normal.  Mouth/Throat: Oropharynx is clear and moist.  Eyes: EOM are normal.  Neck: Trachea normal, normal range of motion and full passive range of motion without pain. Neck supple. No JVD present. Carotid bruit is not present. No thyromegaly present.  Cardiovascular: Normal rate, regular rhythm, normal heart sounds and intact distal pulses.  Exam reveals no gallop and no friction rub.   No murmur heard. Pulmonary/Chest: Effort normal and breath sounds normal.  Abdominal: Soft. Bowel sounds are normal. She exhibits no distension and no mass. There is no tenderness.  Musculoskeletal: Normal range of motion.  Lymphadenopathy:    She has no cervical adenopathy.  Neurological: She is alert and oriented to person, place, and time. She has normal reflexes.  Skin: Skin is warm and dry.  Psychiatric: She has a normal mood and affect. Her behavior is normal. Judgment and thought content normal.   BP 130/74  Pulse 57  Temp(Src) 98.6 F (37 C) (Oral)  Ht 5' 3.5" (1.613 m)  Wt 137 lb (62.143 kg)  BMI 23.88 kg/m2         Assessment & Plan:   1. Other specified hypothyroidism   2. Essential hypertension   3. Hyperlipidemia with target LDL less than 100   4. Osteoporosis screening    Orders Placed This Encounter  Procedures  . DG Bone Density    Standing Status: Future     Number of  Occurrences:      Standing Expiration Date: 01/11/2015    Order Specific Question:  Reason for Exam (SYMPTOM  OR DIAGNOSIS REQUIRED)    Answer:   screening    Order Specific Question:  Preferred imaging location?    Answer:  Internal  . CMP14+EGFR  . NMR, lipoprofile  . Thyroid Panel With TSH   Meds ordered this encounter  Medications  . amLODipine-benazepril (LOTREL) 5-40 MG per capsule    Sig: TAKE ONE CAPSULE BY MOUTH ONCE DAILY    Dispense:  90 capsule    Refill:  1    Order Specific Question:  Supervising Provider    Answer:  MOORE, DONALD W [[id number]]  . atorvastatin (LIPITOR) 20 MG tablet    Sig: Take 1 tablet (20 mg total) by mouth daily.    Dispense:  90 tablet    Refill:  1    Order Specific Question:  Supervising Provider    Answer:  MOORE, DONALD W [[id number]]  . levothyroxine (SYNTHROID, LEVOTHROID) 25 MCG tablet    Sig: Take 1 tablet (25 mcg total) by mouth daily.    Dispense:  90 tablet    Refill:  1    Order Specific Question:  Supervising Provider    Answer:  MOORE, DONALD W [[id number]]  . metoprolol tartrate (LOPRESSOR) 25 MG tablet    Sig: Take 1 tablet (25 mg total) by mouth 2 (two) times daily.    Dispense:  180 tablet    Refill:  1    Order Specific Question:  Supervising Provider    Answer:  MOORE, DONALD W [[id number]]   hemoccult cards given to patient with directions Labs pending Health maintenance reviewed Diet and exercise encouraged Continue all meds Follow up  In 3 month   Mary-Margaret Martin, FNP    

## 2013-11-12 LAB — CMP14+EGFR
ALBUMIN: 4.7 g/dL (ref 3.6–4.8)
ALT: 14 IU/L (ref 0–32)
AST: 30 IU/L (ref 0–40)
Albumin/Globulin Ratio: 1.7 (ref 1.1–2.5)
Alkaline Phosphatase: 58 IU/L (ref 39–117)
BILIRUBIN TOTAL: 0.4 mg/dL (ref 0.0–1.2)
BUN / CREAT RATIO: 14 (ref 11–26)
BUN: 10 mg/dL (ref 8–27)
CO2: 22 mmol/L (ref 18–29)
Calcium: 10.2 mg/dL (ref 8.7–10.3)
Chloride: 102 mmol/L (ref 97–108)
Creatinine, Ser: 0.71 mg/dL (ref 0.57–1.00)
GFR calc non Af Amer: 88 mL/min/{1.73_m2} (ref >59)
GFR, EST AFRICAN AMERICAN: 101 mL/min/{1.73_m2} (ref >59)
GLOBULIN, TOTAL: 2.8 g/dL (ref 1.5–4.5)
Glucose: 83 mg/dL (ref 65–99)
Potassium: 5.2 mmol/L (ref 3.5–5.2)
SODIUM: 137 mmol/L (ref 134–144)
Total Protein: 7.5 g/dL (ref 6.0–8.5)

## 2013-11-12 LAB — NMR, LIPOPROFILE
CHOLESTEROL: 334 mg/dL — AB (ref 100–199)
HDL Cholesterol by NMR: 77 mg/dL (ref >39)
HDL PARTICLE NUMBER: 58.1 umol/L (ref >=30.5)
LDL PARTICLE NUMBER: 2687 nmol/L — AB (ref <1000)
LDL SIZE: 21.2 nm (ref >20.5)
LDLC SERPL CALC-MCNC: 181 mg/dL — AB (ref 0–99)
LP-IR SCORE: 62 — AB (ref <=45)
Small LDL Particle Number: 1083 nmol/L — ABNORMAL HIGH (ref <=527)
Triglycerides by NMR: 378 mg/dL — ABNORMAL HIGH (ref 0–149)

## 2013-11-12 LAB — THYROID PANEL WITH TSH
Free Thyroxine Index: 1.7 (ref 1.2–4.9)
T3 Uptake Ratio: 28 % (ref 24–39)
T4, Total: 5.9 ug/dL (ref 4.5–12.0)
TSH: 3.15 u[IU]/mL (ref 0.450–4.500)

## 2013-12-27 ENCOUNTER — Other Ambulatory Visit: Payer: Self-pay | Admitting: Nurse Practitioner

## 2013-12-28 ENCOUNTER — Other Ambulatory Visit: Payer: Self-pay | Admitting: Nurse Practitioner

## 2014-02-05 ENCOUNTER — Ambulatory Visit (INDEPENDENT_AMBULATORY_CARE_PROVIDER_SITE_OTHER): Payer: Medicare Other

## 2014-02-05 ENCOUNTER — Encounter: Payer: Self-pay | Admitting: Pharmacist

## 2014-02-05 ENCOUNTER — Ambulatory Visit (INDEPENDENT_AMBULATORY_CARE_PROVIDER_SITE_OTHER): Payer: Medicare Other | Admitting: Pharmacist

## 2014-02-05 VITALS — Ht 63.75 in | Wt 136.0 lb

## 2014-02-05 DIAGNOSIS — Z13820 Encounter for screening for osteoporosis: Secondary | ICD-10-CM

## 2014-02-05 DIAGNOSIS — M8580 Other specified disorders of bone density and structure, unspecified site: Secondary | ICD-10-CM | POA: Insufficient documentation

## 2014-02-05 DIAGNOSIS — M899 Disorder of bone, unspecified: Secondary | ICD-10-CM

## 2014-02-05 DIAGNOSIS — M949 Disorder of cartilage, unspecified: Secondary | ICD-10-CM

## 2014-02-05 LAB — HM DEXA SCAN

## 2014-02-05 NOTE — Patient Instructions (Signed)
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
## 2014-03-31 ENCOUNTER — Other Ambulatory Visit: Payer: Self-pay | Admitting: Nurse Practitioner

## 2014-04-18 ENCOUNTER — Ambulatory Visit (INDEPENDENT_AMBULATORY_CARE_PROVIDER_SITE_OTHER): Payer: Medicare Other

## 2014-04-18 DIAGNOSIS — Z111 Encounter for screening for respiratory tuberculosis: Secondary | ICD-10-CM

## 2014-04-21 LAB — TB SKIN TEST
Induration: 0 mm
TB Skin Test: NEGATIVE

## 2014-05-01 ENCOUNTER — Ambulatory Visit: Payer: Medicare Other

## 2014-05-01 ENCOUNTER — Ambulatory Visit (INDEPENDENT_AMBULATORY_CARE_PROVIDER_SITE_OTHER): Payer: Medicare Other | Admitting: *Deleted

## 2014-05-01 DIAGNOSIS — Z23 Encounter for immunization: Secondary | ICD-10-CM

## 2014-05-02 ENCOUNTER — Other Ambulatory Visit: Payer: Self-pay | Admitting: Nurse Practitioner

## 2014-07-31 ENCOUNTER — Encounter: Payer: Self-pay | Admitting: Nurse Practitioner

## 2014-07-31 ENCOUNTER — Ambulatory Visit (INDEPENDENT_AMBULATORY_CARE_PROVIDER_SITE_OTHER): Payer: Medicare Other | Admitting: Nurse Practitioner

## 2014-07-31 VITALS — BP 132/84 | HR 67 | Temp 97.5°F | Ht 63.0 in | Wt 143.0 lb

## 2014-07-31 DIAGNOSIS — Z1212 Encounter for screening for malignant neoplasm of rectum: Secondary | ICD-10-CM

## 2014-07-31 DIAGNOSIS — M8580 Other specified disorders of bone density and structure, unspecified site: Secondary | ICD-10-CM | POA: Diagnosis not present

## 2014-07-31 DIAGNOSIS — E785 Hyperlipidemia, unspecified: Secondary | ICD-10-CM

## 2014-07-31 DIAGNOSIS — Z23 Encounter for immunization: Secondary | ICD-10-CM

## 2014-07-31 DIAGNOSIS — E038 Other specified hypothyroidism: Secondary | ICD-10-CM | POA: Diagnosis not present

## 2014-07-31 DIAGNOSIS — C50912 Malignant neoplasm of unspecified site of left female breast: Secondary | ICD-10-CM

## 2014-07-31 DIAGNOSIS — I10 Essential (primary) hypertension: Secondary | ICD-10-CM

## 2014-07-31 MED ORDER — METOPROLOL TARTRATE 25 MG PO TABS: 25.0000 mg | ORAL_TABLET | Freq: Two times a day (BID) | ORAL | Status: DC

## 2014-07-31 MED ORDER — ATORVASTATIN CALCIUM 20 MG PO TABS: 20.0000 mg | ORAL_TABLET | Freq: Every day | ORAL | Status: DC

## 2014-07-31 MED ORDER — LEVOTHYROXINE SODIUM 25 MCG PO TABS: 25.0000 ug | ORAL_TABLET | Freq: Every day | ORAL | Status: AC

## 2014-07-31 MED ORDER — AMLODIPINE BESY-BENAZEPRIL HCL 5-40 MG PO CAPS: 1.0000 | ORAL_CAPSULE | Freq: Every day | ORAL | Status: DC

## 2014-07-31 NOTE — Progress Notes (Signed)
Subjective:    Patient ID: Paula Alvarez, female    DOB: [birth year], 69 y.o.   MRN: [id number]   Patient here today for follow up of chronic medical problems.   Hypertension This is a chronic problem. The current episode started more than 1 year ago. The problem is unchanged. The problem is controlled. Pertinent negatives include no chest pain, headaches, neck pain, palpitations or shortness of breath. Risk factors for coronary artery disease include dyslipidemia, post-menopausal state and sedentary lifestyle. Past treatments include ACE inhibitors and beta blockers. The current treatment provides significant improvement. Compliance problems include diet and exercise.  Hypertensive end-organ damage includes a thyroid problem.  Hyperlipidemia This is a chronic problem. The current episode started more than 1 year ago. The problem is controlled. Recent lipid tests were reviewed and are normal. Exacerbating diseases include hypothyroidism. She has no history of diabetes or obesity. Pertinent negatives include no chest pain or shortness of breath. Current antihyperlipidemic treatment includes statins. The current treatment provides moderate improvement of lipids. Compliance problems include adherence to diet and adherence to exercise.  Risk factors for coronary artery disease include dyslipidemia, hypertension, post-menopausal and a sedentary lifestyle.  Thyroid Problem Visit type: hypothyroidism. Patient reports no constipation, diarrhea, fatigue, heat intolerance, palpitations or visual change. Her past medical history is significant for hyperlipidemia. There is no history of diabetes.  osteopenia dexa scan up to date- on no meds Hx breast cancer Has completed all treatment- doing well.   Review of Systems  Constitutional: Negative for fatigue.  Respiratory: Negative for shortness of breath.   Cardiovascular: Negative for chest pain and palpitations.  Gastrointestinal: Negative for diarrhea and  constipation.  Endocrine: Negative for heat intolerance.  Musculoskeletal: Negative for neck pain.  Neurological: Negative for headaches.  All other systems reviewed and are negative.      Objective:   Physical Exam  Constitutional: She is oriented to person, place, and time. She appears well-developed and well-nourished.  HENT:  Nose: Nose normal.  Mouth/Throat: Oropharynx is clear and moist.  Eyes: EOM are normal.  Neck: Trachea normal, normal range of motion and full passive range of motion without pain. Neck supple. No JVD present. Carotid bruit is not present. No thyromegaly present.  Cardiovascular: Normal rate, regular rhythm, normal heart sounds and intact distal pulses.  Exam reveals no gallop and no friction rub.   No murmur heard. Pulmonary/Chest: Effort normal and breath sounds normal.  Abdominal: Soft. Bowel sounds are normal. She exhibits no distension and no mass. There is no tenderness.  Musculoskeletal: Normal range of motion.  Lymphadenopathy:    She has no cervical adenopathy.  Neurological: She is alert and oriented to person, place, and time. She has normal reflexes.  Skin: Skin is warm and dry.  Psychiatric: She has a normal mood and affect. Her behavior is normal. Judgment and thought content normal.   BP 132/84 mmHg  Pulse 67  Temp(Src) 97.5 F (36.4 C) (Oral)  Ht 5' 3" (1.6 m)  Wt 143 lb (64.864 kg)  BMI 25.34 kg/m2         Assessment & Plan:  1. Essential hypertension Do not add slat to diet - CMP14+EGFR - metoprolol tartrate (LOPRESSOR) 25 MG tablet; Take 1 tablet (25 mg total) by mouth 2 (two) times daily.  Dispense: 180 tablet; Refill: 1 - amLODipine-benazepril (LOTREL) 5-40 MG per capsule; Take 1 capsule by mouth daily.  Dispense: 90 capsule; Refill: 1  2. Other specified hypothyroidism - levothyroxine (SYNTHROID, LEVOTHROID) 25 MCG  tablet; Take 1 tablet (25 mcg total) by mouth daily.  Dispense: 90 tablet; Refill: 1  3.  Osteopenia Weight bearing exercises - Vit D  25 hydroxy (rtn osteoporosis monitoring)  4. Hyperlipidemia with target LDL less than 100 Low fat diet  - NMR, lipoprofile - atorvastatin (LIPITOR) 20 MG tablet; Take 1 tablet (20 mg total) by mouth daily.  Dispense: 30 tablet; Refill: 1  5. Breast cancer, left breast Keep follow up appintments  6. Screening for malignant neoplasm of the rectum - Fecal occult blood, imunochemical; Future    Labs pending Health maintenance reviewed Diet and exercise encouraged Continue all meds Follow up  In 3 months   [place], FNP

## 2014-07-31 NOTE — Addendum Note (Signed)
Addended by: Rolena Infante on: 07/31/2014 02:36 PM   Modules accepted: Orders

## 2014-07-31 NOTE — Patient Instructions (Signed)
Bone Health Our bones do many things. They provide structure, protect organs, anchor muscles, and store calcium. Adequate calcium in your diet and weight-bearing physical activity help build strong bones, improve bone amounts, and may reduce the risk of weakening of bones (osteoporosis) later in life. PEAK BONE MASS By age 69, the average woman has acquired most of her skeletal bone mass. A large decline occurs in older adults which increases the risk of osteoporosis. In women this occurs around the time of menopause. It is important for young girls to reach their peak bone mass in order to maintain bone health throughout life. A person with high bone mass as a young adult will be more likely to have a higher bone mass later in life. Not enough calcium consumption and physical activity early on could result in a failure to achieve optimum bone mass in adulthood. OSTEOPOROSIS Osteoporosis is a disease of the bones. It is defined as low bone mass with deterioration of bone structure. Osteoporosis leads to an increase risk of fractures with falls. These fractures commonly happen in the wrist, hip, and spine. While men and women of all ages and background can develop osteoporosis, some of the risk factors for osteoporosis are:  Female.  White.  Postmenopausal.  Older adults.  Small in body size.  Eating a diet low in calcium.  Physically inactive.  Smoking.  Use of some medications.  Family history. CALCIUM Calcium is a mineral needed by the body for healthy bones, teeth, and proper function of the heart, muscles, and nerves. The body cannot produce calcium so it must be absorbed through food. Good sources of calcium include:  Dairy products (low fat or nonfat milk, cheese, and yogurt).  Dark green leafy vegetables (bok choy and broccoli).  Calcium fortified foods (orange juice, cereal, bread, soy beverages, and tofu products).  Nuts (almonds). Recommended amounts of calcium vary  for individuals. RECOMMENDED CALCIUM INTAKES Age and Amount in mg per day  Children 1 to 3 years / 700 mg  Children 4 to 8 years / 1,000 mg  Children 9 to 13 years / 1,300 mg  Teens 14 to 18 years / 1,300 mg  Adults 19 to 50 years / 1,000 mg  Adult women 51 to 70 years / 1,200 mg  Adults 71 years and older / 1,200 mg  Pregnant and breastfeeding teens / 1,300 mg  Pregnant and breastfeeding adults / 1,000 mg Vitamin D also plays an important role in healthy bone development. Vitamin D helps in the absorption of calcium. WEIGHT-BEARING PHYSICAL ACTIVITY Regular physical activity has many positive health benefits. Benefits include strong bones. Weight-bearing physical activity early in life is important in reaching peak bone mass. Weight-bearing physical activities cause muscles and bones to work against gravity. Some examples of weight bearing physical activities include:  Walking, jogging, or running.  Field Hockey.  Jumping rope.  Dancing.  Soccer.  Tennis or Racquetball.  Stair climbing.  Basketball.  Hiking.  Weight lifting.  Aerobic fitness classes. Including weight-bearing physical activity into an exercise plan is a great way to keep bones healthy. Adults: Engage in at least 30 minutes of moderate physical activity on most, preferably all, days of the week. Children: Engage in at least 60 minutes of moderate physical activity on most, preferably all, days of the week. FOR MORE INFORMATION [place] Department of Agriculture, Center for Nutrition Policy and Promotion: www.cnpp.usda.gov National Osteoporosis Foundation: www.nof.org Document Released: 07/23/2003 Document Revised: 08/27/2012 Document Reviewed: 10/22/2008 ExitCare Patient Information   2015 [employer]. This information is not intended to replace advice given to you by your health care provider. Make sure you discuss any questions you have with your health care provider.  

## 2014-08-01 LAB — CMP14+EGFR
ALBUMIN: 4.1 g/dL (ref 3.6–4.8)
ALT: 36 IU/L — ABNORMAL HIGH (ref 0–32)
AST: 98 IU/L — ABNORMAL HIGH (ref 0–40)
Albumin/Globulin Ratio: 1.5 (ref 1.1–2.5)
Alkaline Phosphatase: 177 IU/L — ABNORMAL HIGH (ref 39–117)
BUN/Creatinine Ratio: 9 — ABNORMAL LOW (ref 11–26)
BUN: 8 mg/dL (ref 8–27)
Bilirubin Total: 0.4 mg/dL (ref 0.0–1.2)
CHLORIDE: 101 mmol/L (ref 97–108)
CO2: 21 mmol/L (ref 18–29)
CREATININE: 0.85 mg/dL (ref 0.57–1.00)
Calcium: 9.4 mg/dL (ref 8.7–10.3)
GFR calc Af Amer: 81 mL/min/{1.73_m2} (ref >59)
GFR calc non Af Amer: 71 mL/min/{1.73_m2} (ref >59)
GLUCOSE: 82 mg/dL (ref 65–99)
Globulin, Total: 2.8 g/dL (ref 1.5–4.5)
Potassium: 4.2 mmol/L (ref 3.5–5.2)
Sodium: 141 mmol/L (ref 134–144)
TOTAL PROTEIN: 6.9 g/dL (ref 6.0–8.5)

## 2014-08-01 LAB — NMR, LIPOPROFILE
CHOLESTEROL: 190 mg/dL (ref 100–199)
HDL CHOLESTEROL BY NMR: 80 mg/dL (ref >39)
HDL PARTICLE NUMBER: 52.8 umol/L (ref >=30.5)
LDL PARTICLE NUMBER: 894 nmol/L (ref <1000)
LDL Size: 21 nm (ref >20.5)
LDL-C: 74 mg/dL (ref 0–99)
LP-IR Score: 45 (ref <=45)
Small LDL Particle Number: 323 nmol/L (ref <=527)
Triglycerides by NMR: 180 mg/dL — ABNORMAL HIGH (ref 0–149)

## 2014-08-01 LAB — VITAMIN D 25 HYDROXY (VIT D DEFICIENCY, FRACTURES): Vit D, 25-Hydroxy: 12.4 ng/mL — ABNORMAL LOW (ref 30.0–100.0)

## 2014-10-03 ENCOUNTER — Encounter: Payer: Self-pay | Admitting: Nurse Practitioner

## 2014-10-03 ENCOUNTER — Ambulatory Visit (INDEPENDENT_AMBULATORY_CARE_PROVIDER_SITE_OTHER): Payer: Medicare Other | Admitting: Nurse Practitioner

## 2014-10-03 VITALS — BP 145/75 | HR 89 | Temp 97.5°F | Ht 63.0 in | Wt 137.8 lb

## 2014-10-03 DIAGNOSIS — S29019A Strain of muscle and tendon of unspecified wall of thorax, initial encounter: Secondary | ICD-10-CM

## 2014-10-03 DIAGNOSIS — S29012A Strain of muscle and tendon of back wall of thorax, initial encounter: Secondary | ICD-10-CM

## 2014-10-03 NOTE — Patient Instructions (Signed)

## 2014-10-03 NOTE — Progress Notes (Signed)
   Subjective:    Patient ID: Paula Alvarez, female    DOB: 1946-02-07, 69 y.o.   MRN: [id number]  HPI Patient in today c/o back pain- said she  Lifted some mulch a couple of weeks ago and started having low back pain- it moved up to mid back . Says that pain comes and goes and she is having slight wheezing and dry cough at night. No fever- no congestion.    Review of Systems  Constitutional: Negative.  Negative for fever and chills.  HENT: Negative.   Respiratory: Positive for cough and wheezing. Negative for shortness of breath.   Cardiovascular: Negative.   Gastrointestinal: Negative.   Genitourinary: Negative.   Musculoskeletal: Positive for back pain.  Neurological: Negative.   Psychiatric/Behavioral: Negative.   All other systems reviewed and are negative.      Objective:   Physical Exam  Constitutional: She is oriented to person, place, and time. She appears well-developed and well-nourished. No distress.  Cardiovascular: Normal rate, regular rhythm and normal heart sounds.   Pulmonary/Chest: Effort normal and breath sounds normal. No respiratory distress. She has no wheezes. She has no rales. She exhibits no tenderness.  Musculoskeletal:  FROM of lumbar and thoracic spine without pain- no point tenderness noted  Neurological: She is alert and oriented to person, place, and time. She has normal reflexes.  Skin: Skin is warm.  Psychiatric: She has a normal mood and affect. Her behavior is normal. Judgment and thought content normal.   BP 145/75 mmHg  Pulse 89  Temp(Src) 97.5 F (36.4 C) (Oral)  Ht 5\' 3"  (1.6 m)  Wt 137 lb 12.8 oz (62.506 kg)  BMI 24.42 kg/m2        Assessment & Plan:   1. Strain of thoracic region, initial encounter    Moist heat Motrin or tylenol OTC Rest  No heavy lifting  RTO if not improving  Mary-Margaret Hassell Done, FNP

## 2014-10-21 ENCOUNTER — Encounter: Payer: Self-pay | Admitting: *Deleted

## 2014-10-21 ENCOUNTER — Ambulatory Visit (INDEPENDENT_AMBULATORY_CARE_PROVIDER_SITE_OTHER): Payer: Medicare Other | Admitting: *Deleted

## 2014-10-21 VITALS — BP 111/67 | HR 58 | Ht 63.25 in | Wt 133.0 lb

## 2014-10-21 DIAGNOSIS — Z0000 Encounter for general adult medical examination without abnormal findings: Secondary | ICD-10-CM

## 2014-10-21 NOTE — Progress Notes (Signed)
Patient ID: Paula Alvarez, female   DOB: 09-23-1945, 69 y.o.   MRN: [id number]   Subjective:   Paula Alvarez is a 69 y.o. female who presents for an Initial Medicare Annual Wellness Visit. 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 She lives at home and has one daughter, 4 grand-daughters and one Animator. 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 Her diet mainly consists of vegetables, lean proteins such as chicken and fish, and fruit.   Review of Systems      Cardiac Risk Factors include: dyslipidemia;hypertension;smoking/ tobacco exposure;advanced age (>91men, >54 women)  Musculoskeletal: Continued back pain. Last visit was on 10/03/14 and diagnosed with muscle strain. She is using a muscle rub that helps.   Other systems negative.     Objective:    Today's Vitals   10/21/14 1426  BP: 111/67  Pulse: 58  Height: 5' 3.25" (1.607 m)  Weight: 133 lb (60.328 kg)    Current Medications (verified) Outpatient Encounter Prescriptions as of 10/21/2014  Medication Sig  . amLODipine-benazepril (LOTREL) 5-40 MG per capsule Take 1 capsule by mouth daily.  . calcium-vitamin D (OSCAL WITH D) 500-200 MG-UNIT per tablet Take 1 tablet by mouth daily with breakfast.  . levothyroxine (SYNTHROID, LEVOTHROID) 25 MCG tablet Take 1 tablet (25 mcg total) by mouth daily.  . metoprolol tartrate (LOPRESSOR) 25 MG tablet Take 1 tablet (25 mg total) by mouth 2 (two) times daily.  . [DISCONTINUED] atorvastatin (LIPITOR) 20 MG tablet Take 1 tablet (20 mg total) by mouth daily.   No facility-administered encounter medications on file as of 10/21/2014.    Allergies (verified) Review of patient's allergies indicates no known allergies.   History: Past Medical History  Diagnosis Date  . Hyperlipidemia   . Hypertension   . Thyroid disease   . Cancer 01/2010    Left breast. Partial mastectomy.   Past Surgical History  Procedure Laterality Date  .  Mastectomy, partial  03/22/2010    left   Family History  Problem Relation Age of Onset  . Cancer Mother     bone  . Heart attack Father    Social History   Occupational History  . Retired [employer] Teacher   Social History Main Topics  . Smoking status: Former Smoker -- 10 years    Quit date: 10/21/2007  . Smokeless tobacco: Never Used  . Alcohol Use: No  . Drug Use: No  . Sexual Activity: Not Currently    Activities of Daily Living In your present state of health, do you have any difficulty performing the following activities: 10/21/2014 07/31/2014  Hearing? N N  Vision? N N  Difficulty concentrating or making decisions? N N  Walking or climbing stairs? N N  Dressing or bathing? N N  Doing errands, shopping? N N  Preparing Food and eating ? N -  Using the Toilet? N -  In the past six months, have you accidently leaked urine? N -  Do you have problems with loss of bowel control? N -  Managing your Medications? N -  Managing your Finances? N -  Housekeeping or managing your Housekeeping? N -    Immunizations and Health Maintenance Immunization History  Administered Date(s) Administered  . Influenza,inj,Quad PF,36+ Mos 03/29/2013, 05/01/2014  . PPD Test 04/18/2014  . Tdap 07/31/2014   There are no preventive care reminders to display for this patient.  Patient Care Team: Mary-Margaret Hassell Done,  FNP as PCP - General (Nurse Practitioner)      Assessment:   This is a routine wellness examination for Paula Alvarez.   Hearing/Vision screen No hearing or vision deficits noted during visit today.   Dietary issues and exercise activities discussed: Current Exercise Habits:: Home exercise routine, Type of exercise: treadmill, Time (Minutes): 30, Frequency (Times/Week): 6, Weekly Exercise (Minutes/Week): 180, Intensity: Moderate  Goals Continue to exercise at least 30 minutes 3 days per week  Depression Screen PHQ 2/9 Scores 10/21/2014 07/31/2014  02/05/2014 11/11/2013 11/11/2013  PHQ - 2 Score 0 0 0 0 0    Fall Risk Fall Risk  10/21/2014 07/31/2014 02/05/2014 11/11/2013 11/11/2013  Falls in the past year? No No No No No    Cognitive Function: MMSE - Mini Mental State Exam 10/21/2014  Orientation to time 1  Orientation to Place 5  Registration 3  Attention/ Calculation 5  Recall 3  Language- name 2 objects 2  Language- repeat 1  Language- follow 3 step command 3  Language- read & follow direction 1  Write a sentence 1  No deficit noted  Screening Tests Health Maintenance  Topic Date Due  . ZOSTAVAX  11/12/2014 (Originally 07/31/2005)  . PAP SMEAR  01/31/2015 (Originally 09/05/2013)  . COLON CANCER SCREENING ANNUAL FOBT  01/31/2015 (Originally 08/01/1995)  . COLONOSCOPY  01/31/2015 (Originally 08/01/1995)  . PNA vac Low Risk Adult (1 of 2 - PCV13) 01/31/2015 (Originally 08/01/2010)  . INFLUENZA VACCINE  21-Dec-2014  . MAMMOGRAM  02/13/2016  . TETANUS/TDAP  07/30/2024  . DEXA SCAN  Completed      Plan:   Keep follow up with Shelah Lewandowsky to discuss continued back pain. Call back if symptoms worsen and may cancel if they resolve before then.   During the course of the visit, Hendel was educated and counseled about the following appropriate screening and preventive services:   Asked patient to consider the Prevnar and Zostavax. Information provided. Her Td is up to date and she has annual flu shots.   Electrocardiogram-Due at next routine visit  Cardiovascular disease screening-Lipids monitored at routine visits. Patient prefers to not take statins due to muscle pain.   Colorectal cancer screening-Declines colonoscopy at this time but will consider. She will return FOBT.  Bone density screening-Done 02/05/14  Diabetes screening-Glucose monitored at routine office visit  Glaucoma screening-Due for eye exam. Patient will schedule.   Mammography-done 07/31/14  Pap-declined. Patient is post menopausal and had at least 3 normal  paps in the 10 years preceding menopause.   Nutrition counseling-continue to eat a balanced diet with lean proteins, fruits, and vegetables.   Exercise-continue to exercise at least 30 minutes 3 days per week   Patient Instructions (the written plan) were given to the patient.    Chong Sicilian, RN   10/21/2014      I have reviewed and agree with the above AWV documentation.  Claretta Fraise, M.D.

## 2014-10-21 NOTE — Patient Instructions (Addendum)
Continue to walk for at least 30 minutes 3 days per week.  Continue to eat a balanced diet with lean proteins, fruits, and vegetables.  Keep follow up appointment with Shelah Lewandowsky for back pain.  Return FOBT and consider colonoscopy.  Consider the Pneumonia vaccine (Prevnar) and shingles vaccine (Zostavax)  Pneumococcal Vaccine, Polyvalent suspension for injection What is this medicine? PNEUMOCOCCAL VACCINE, POLYVALENT (NEU mo KOK al vak SEEN, pol ee VEY luhnt) is a vaccine to prevent pneumococcus bacteria infection. These bacteria are a major cause of ear infections, 'Strep throat' infections, and serious pneumonia, meningitis, or blood infections worldwide. These vaccines help the body to produce antibodies (protective substances) that help your body defend against these bacteria. This vaccine is recommended for infants and young children. This vaccine will not treat an infection. This medicine may be used for other purposes; ask your health care provider or pharmacist if you have questions. COMMON BRAND NAME(S): Prevnar 13 What should I tell my health care provider before I take this medicine? They need to know if you have any of these conditions: -bleeding problems -fever -immune system problems -low platelet count in the blood -seizures -an unusual or allergic reaction to pneumococcal vaccine, diphtheria toxoid, other vaccines, latex, other medicines, foods, dyes, or preservatives -pregnant or trying to get pregnant -breast-feeding How should I use this medicine? This vaccine is for injection into a muscle. It is given by a health care professional. A copy of Vaccine Information Statements will be given before each vaccination. Read this sheet carefully each time. The sheet may change frequently. Talk to your pediatrician regarding the use of this medicine in children. While this drug may be prescribed for children as young as 56 weeks old for selected conditions, precautions do  apply. Overdosage: If you think you have taken too much of this medicine contact a poison control center or emergency room at once. NOTE: This medicine is only for you. Do not share this medicine with others. What if I miss a dose? It is important not to miss your dose. Call your doctor or health care professional if you are unable to keep an appointment. What may interact with this medicine? -medicines for cancer chemotherapy -medicines that suppress your immune function -medicines that treat or prevent blood clots like warfarin, enoxaparin, and dalteparin -steroid medicines like prednisone or cortisone This list may not describe all possible interactions. Give your health care provider a list of all the medicines, herbs, non-prescription drugs, or dietary supplements you use. Also tell them if you smoke, drink alcohol, or use illegal drugs. Some items may interact with your medicine. What should I watch for while using this medicine? Mild fever and pain should go away in 3 days or less. Report any unusual symptoms to your doctor or health care professional. What side effects may I notice from receiving this medicine? Side effects that you should report to your doctor or health care professional as soon as possible: -allergic reactions like skin rash, itching or hives, swelling of the face, lips, or tongue -breathing problems -confused -fever over 102 degrees F -pain, tingling, numbness in the hands or feet -seizures -unusual bleeding or bruising -unusual muscle weakness Side effects that usually do not require medical attention (report to your doctor or health care professional if they continue or are bothersome): -aches and pains -diarrhea -fever of 102 degrees F or less -headache -irritable -loss of appetite -pain, tender at site where injected -trouble sleeping This list may not describe all possible  side effects. Call your doctor for medical advice about side effects. You may  report side effects to FDA at 1-800-FDA-1088. Where should I keep my medicine? This does not apply. This vaccine is given in a clinic, pharmacy, doctor's office, or other health care setting and will not be stored at home. NOTE: This sheet is a summary. It may not cover all possible information. If you have questions about this medicine, talk to your doctor, pharmacist, or health care provider.  2015, Elsevier/Gold Standard. (2008-07-15 10:17:22)    Varicella-Zoster Virus Vaccine Live injection What is this medicine? VARICELLA VIRUS VACCINE (var uh SEL uh VAHY ruhs vak SEEN) is used to prevent infections of chickenpox. HERPES ZOSTER VIRUS VACCINE (HUR peez ZOS ter vahy ruhs vak SEEN) is used to prevent shingles in adults 69 years old and over. This vaccine is not used to treat shingles or nerve pain from shingles. These medicines may be used for other purposes; ask your health care provider or pharmacist if you have questions. This medicine may be used for other purposes; ask your health care provider or pharmacist if you have questions. COMMON BRAND NAME(S): Varivax, Zostavax What should I tell my health care provider before I take this medicine? They need to know if you have any of the following conditions: -blood disorders or disease -cancer like leukemia or lymphoma -immune system problems or therapy -infection with fever -recent immune globulin therapy -tuberculosis -an unusual or allergic reaction to vaccines, neomycin, gelatin, other medicines, foods, dyes, or preservatives -pregnant or trying to get pregnant -breast-feeding How should I use this medicine? These vaccines are for injection under the skin. They are given by a health care professional. A copy of Vaccine Information Statements will be given before each varicella virus vaccination. Read this sheet carefully each time. The sheet may change frequently. A Vaccine Information Statement is not given before the herpes zoster  virus vaccine. Talk to your pediatrician regarding the use of the varicella virus vaccine in children. While this drug may be prescribed for children as young as 20 months of age for selected conditions, precautions do apply. The herpes zoster virus vaccine is not approved in children. Overdosage: If you think you have taken too much of this medicine contact a poison control center or emergency room at once. NOTE: This medicine is only for you. Do not share this medicine with others. What if I miss a dose? Keep appointments for follow-up (booster) doses of varicella virus vaccine as directed. It is important not to miss your dose. Call your doctor or health care professional if you are unable to keep an appointment. Follow-up (booster) doses are not needed for the herpes zoster virus vaccine. What may interact with this medicine? Do not take these medicines with any of the following medications: -adalimumab -anakinra -etanercept -infliximab -medicines that suppress your immune system -medicines to treat cancer These medicines may also interact with the following medications: -aspirin and aspirin-like medicines (varicella virus vaccine only) -blood transfusions (varicella virus vaccine only) -immunoglobulins (varicella virus vaccine only) -steroid medicines like prednisone or cortisone This list may not describe all possible interactions. Give your health care provider a list of all the medicines, herbs, non-prescription drugs, or dietary supplements you use. Also tell them if you smoke, drink alcohol, or use illegal drugs. Some items may interact with your medicine. What should I watch for while using this medicine? Visit your doctor for regular check ups. These vaccines, like all vaccines, may not fully protect everyone. After  receiving these vaccines it may be possible to pass chickenpox infection to others. For up to 6 weeks, avoid people with immune system problems, pregnant women who  have not had chickenpox, newborns of women who have not had chickenpox, and all newborns born at less than 28 weeks of pregnancy. Talk to your doctor for more information. Do not become pregnant for 3 months after taking these vaccines. Women should inform their doctor if they wish to become pregnant or think they might be pregnant. There is a potential for serious side effects to an unborn child. Talk to your health care professional or pharmacist for more information. What side effects may I notice from receiving this medicine? Side effects that you should report to your doctor or health care professional as soon as possible: -allergic reactions like skin rash, itching or hives, swelling of the face, lips, or tongue -breathing problems -extreme changes in behavior -feeling faint or lightheaded, falls -fever over 102 degrees F -pain, tingling, numbness in the hands or feet -redness, blistering, peeling or loosening of the skin, including inside the mouth -seizures -unusually weak or tired Side effects that usually do not require medical attention (report to your doctor or health care professional if they continue or are bothersome): -aches or pains -chickenpox-like rash -diarrhea -headache -low-grade fever under 102 degrees F -loss of appetite -nausea, vomiting -redness, pain, swelling at site where injected -sleepy -trouble sleeping This list may not describe all possible side effects. Call your doctor for medical advice about side effects. You may report side effects to FDA at 1-800-FDA-1088. Where should I keep my medicine? These drugs are given in a hospital or clinic and will not be stored at home. NOTE: This sheet is a summary. It may not cover all possible information. If you have questions about this medicine, talk to your doctor, pharmacist, or health care provider.  2015, Elsevier/Gold Standard. (2013-01-04 14:24:35)   Health Maintenance Adopting a healthy lifestyle and  getting preventive care can go a long way to promote health and wellness. Talk with your health care provider about what schedule of regular examinations is right for you. This is a good chance for you to check in with your provider about disease prevention and staying healthy. In between checkups, there are plenty of things you can do on your own. Experts have done a lot of research about which lifestyle changes and preventive measures are most likely to keep you healthy. Ask your health care provider for more information. WEIGHT AND DIET  Eat a healthy diet  Be sure to include plenty of vegetables, fruits, low-fat dairy products, and lean protein.  Do not eat a lot of foods high in solid fats, added sugars, or salt.  Get regular exercise. This is one of the most important things you can do for your health.  Most adults should exercise for at least 150 minutes each week. The exercise should increase your heart rate and make you sweat (moderate-intensity exercise).  Most adults should also do strengthening exercises at least twice a week. This is in addition to the moderate-intensity exercise.  Maintain a healthy weight  Body mass index (BMI) is a measurement that can be used to identify possible weight problems. It estimates body fat based on height and weight. Your health care provider can help determine your BMI and help you achieve or maintain a healthy weight.  For females 76 years of age and older:   A BMI below 18.5 is considered underweight.  A  BMI of 18.5 to 24.9 is normal.  A BMI of 25 to 29.9 is considered overweight.  A BMI of 30 and above is considered obese.  Watch levels of cholesterol and blood lipids  You should start having your blood tested for lipids and cholesterol at 69 years of age, then have this test every 5 years.  You may need to have your cholesterol levels checked more often if:  Your lipid or cholesterol levels are high.  You are older than 69 years  of age.  You are at high risk for heart disease.  CANCER SCREENING   Lung Cancer  Lung cancer screening is recommended for adults 9-60 years old who are at high risk for lung cancer because of a history of smoking.  A yearly low-dose CT scan of the lungs is recommended for people who:  Currently smoke.  Have quit within the past 15 years.  Have at least a 30-pack-year history of smoking. A pack year is smoking an average of one pack of cigarettes a day for 1 year.  Yearly screening should continue until it has been 15 years since you quit.  Yearly screening should stop if you develop a health problem that would prevent you from having lung cancer treatment.  Breast Cancer  Practice breast self-awareness. This means understanding how your breasts normally appear and feel.  It also means doing regular breast self-exams. Let your health care provider know about any changes, no matter how small.  If you are in your 20s or 30s, you should have a clinical breast exam (CBE) by a health care provider every 1-3 years as part of a regular health exam.  If you are 23 or older, have a CBE every year. Also consider having a breast X-ray (mammogram) every year.  If you have a family history of breast cancer, talk to your health care provider about genetic screening.  If you are at high risk for breast cancer, talk to your health care provider about having an MRI and a mammogram every year.  Breast cancer gene (BRCA) assessment is recommended for women who have family members with BRCA-related cancers. BRCA-related cancers include:  Breast.  Ovarian.  Tubal.  Peritoneal cancers.  Results of the assessment will determine the need for genetic counseling and BRCA1 and BRCA2 testing. Cervical Cancer Routine pelvic examinations to screen for cervical cancer are no longer recommended for nonpregnant women who are considered low risk for cancer of the pelvic organs (ovaries, uterus, and  vagina) and who do not have symptoms. A pelvic examination may be necessary if you have symptoms including those associated with pelvic infections. Ask your health care provider if a screening pelvic exam is right for you.   The Pap test is the screening test for cervical cancer for women who are considered at risk.  If you had a hysterectomy for a problem that was not cancer or a condition that could lead to cancer, then you no longer need Pap tests.  If you are older than 65 years, and you have had normal Pap tests for the past 10 years, you no longer need to have Pap tests.  If you have had past treatment for cervical cancer or a condition that could lead to cancer, you need Pap tests and screening for cancer for at least 20 years after your treatment.  If you no longer get a Pap test, assess your risk factors if they change (such as having a new sexual partner). This can  affect whether you should start being screened again.  Some women have medical problems that increase their chance of getting cervical cancer. If this is the case for you, your health care provider may recommend more frequent screening and Pap tests.  The human papillomavirus (HPV) test is another test that may be used for cervical cancer screening. The HPV test looks for the virus that can cause cell changes in the cervix. The cells collected during the Pap test can be tested for HPV.  The HPV test can be used to screen women 7 years of age and older. Getting tested for HPV can extend the interval between normal Pap tests from three to five years.  An HPV test also should be used to screen women of any age who have unclear Pap test results.  After 69 years of age, women should have HPV testing as often as Pap tests.  Colorectal Cancer  This type of cancer can be detected and often prevented.  Routine colorectal cancer screening usually begins at 69 years of age and continues through 69 years of age.  Your health  care provider may recommend screening at an earlier age if you have risk factors for colon cancer.  Your health care provider may also recommend using home test kits to check for hidden blood in the stool.  A small camera at the end of a tube can be used to examine your colon directly (sigmoidoscopy or colonoscopy). This is done to check for the earliest forms of colorectal cancer.  Routine screening usually begins at age 60.  Direct examination of the colon should be repeated every 5-10 years through 69 years of age. However, you may need to be screened more often if early forms of precancerous polyps or small growths are found. Skin Cancer  Check your skin from head to toe regularly.  Tell your health care provider about any new moles or changes in moles, especially if there is a change in a mole's shape or color.  Also tell your health care provider if you have a mole that is larger than the size of a pencil eraser.  Always use sunscreen. Apply sunscreen liberally and repeatedly throughout the day.  Protect yourself by wearing long sleeves, pants, a wide-brimmed hat, and sunglasses whenever you are outside. HEART DISEASE, DIABETES, AND HIGH BLOOD PRESSURE   Have your blood pressure checked at least every 1-2 years. High blood pressure causes heart disease and increases the risk of stroke.  If you are between 30 years and 34 years old, ask your health care provider if you should take aspirin to prevent strokes.  Have regular diabetes screenings. This involves taking a blood sample to check your fasting blood sugar level.  If you are at a normal weight and have a low risk for diabetes, have this test once every three years after 69 years of age.  If you are overweight and have a high risk for diabetes, consider being tested at a younger age or more often. PREVENTING INFECTION  Hepatitis B  If you have a higher risk for hepatitis B, you should be screened for this virus. You are  considered at high risk for hepatitis B if:  You were born in a country where hepatitis B is common. Ask your health care provider which countries are considered high risk.  Your parents were born in a high-risk country, and you have not been immunized against hepatitis B (hepatitis B vaccine).  You have HIV or AIDS.  You use needles to inject street drugs.  You live with someone who has hepatitis B.  You have had sex with someone who has hepatitis B.  You get hemodialysis treatment.  You take certain medicines for conditions, including cancer, organ transplantation, and autoimmune conditions. Hepatitis C  Blood testing is recommended for:  Everyone born from [birth year] through 1965.  Anyone with known risk factors for hepatitis C. Sexually transmitted infections (STIs)  You should be screened for sexually transmitted infections (STIs) including gonorrhea and chlamydia if:  You are sexually active and are younger than 69 years of age.  You are older than 69 years of age and your health care provider tells you that you are at risk for this type of infection.  Your sexual activity has changed since you were last screened and you are at an increased risk for chlamydia or gonorrhea. Ask your health care provider if you are at risk.  If you do not have HIV, but are at risk, it may be recommended that you take a prescription medicine daily to prevent HIV infection. This is called pre-exposure prophylaxis (PrEP). You are considered at risk if:  You are sexually active and do not regularly use condoms or know the HIV status of your partner(s).  You take drugs by injection.  You are sexually active with a partner who has HIV. Talk with your health care provider about whether you are at high risk of being infected with HIV. If you choose to begin PrEP, you should first be tested for HIV. You should then be tested every 3 months for as long as you are taking PrEP.  PREGNANCY   If you are  premenopausal and you may become pregnant, ask your health care provider about preconception counseling.  If you may become pregnant, take 400 to 800 micrograms (mcg) of folic acid every day.  If you want to prevent pregnancy, talk to your health care provider about birth control (contraception). OSTEOPOROSIS AND MENOPAUSE   Osteoporosis is a disease in which the bones lose minerals and strength with aging. This can result in serious bone fractures. Your risk for osteoporosis can be identified using a bone density scan.  If you are 16 years of age or older, or if you are at risk for osteoporosis and fractures, ask your health care provider if you should be screened.  Ask your health care provider whether you should take a calcium or vitamin D supplement to lower your risk for osteoporosis.  Menopause may have certain physical symptoms and risks.  Hormone replacement therapy may reduce some of these symptoms and risks. Talk to your health care provider about whether hormone replacement therapy is right for you.  HOME CARE INSTRUCTIONS   Schedule regular health, dental, and eye exams.  Stay current with your immunizations.   Do not use any tobacco products including cigarettes, chewing tobacco, or electronic cigarettes.  If you are pregnant, do not drink alcohol.  If you are breastfeeding, limit how much and how often you drink alcohol.  Limit alcohol intake to no more than 1 drink per day for nonpregnant women. One drink equals 12 ounces of beer, 5 ounces of wine, or 1 ounces of hard liquor.  Do not use street drugs.  Do not share needles.  Ask your health care provider for help if you need support or information about quitting drugs.  Tell your health care provider if you often feel depressed.  Tell your health care provider if you have  ever been abused or do not feel safe at home. Document Released: 11/15/2010 Document Revised: 09/16/2013 Document Reviewed:  04/03/2013 [facility] Patient Information 2015 [place], [place]. This information is not intended to replace advice given to you by your health care provider. Make sure you discuss any questions you have with your health care provider.

## 2014-11-04 ENCOUNTER — Ambulatory Visit (INDEPENDENT_AMBULATORY_CARE_PROVIDER_SITE_OTHER): Payer: Medicare Other | Admitting: Nurse Practitioner

## 2014-11-04 ENCOUNTER — Encounter: Payer: Self-pay | Admitting: Nurse Practitioner

## 2014-11-04 ENCOUNTER — Ambulatory Visit (INDEPENDENT_AMBULATORY_CARE_PROVIDER_SITE_OTHER): Payer: Medicare Other

## 2014-11-04 VITALS — BP 114/68 | HR 59 | Temp 96.1°F | Ht 63.25 in | Wt 132.0 lb

## 2014-11-04 DIAGNOSIS — M545 Low back pain, unspecified: Secondary | ICD-10-CM

## 2014-11-04 MED ORDER — PREDNISONE 20 MG PO TABS: ORAL_TABLET | ORAL | Status: DC

## 2014-11-04 MED ORDER — METHYLPREDNISOLONE ACETATE 80 MG/ML IJ SUSP
80.0000 mg | Freq: Once | INTRAMUSCULAR | Status: AC
  Administered 2014-11-04: 80 mg via INTRAMUSCULAR

## 2014-11-04 NOTE — Progress Notes (Signed)
   Subjective:    Patient ID: Paula Alvarez, female    DOB: [birth year], 69 y.o.   MRN: [id number]  HPI Patient in today for follow up of back pain- SHe was seen 10/03/14 c/o mid back pain. Was not given any meds at appointment. SHe says taht her back pain has continued- Went to urgent care on Sunday and he gave her a rx for naprosyn - Patient has not yet picked up rx- the only thing she has been taking is tylenol. The pain is in mid lower back. Rates pain 10/10. Worse when she gets up in the morning. Pain does not radiate. Tylenol helps some. Nothing seems to aggravate it.    Review of Systems  Constitutional: Negative.   HENT: Negative.   Respiratory: Negative.   Cardiovascular: Negative.   Genitourinary: Negative for dysuria, urgency, frequency and pelvic pain.  Musculoskeletal: Positive for back pain.  Neurological: Negative.   Psychiatric/Behavioral: Negative.   All other systems reviewed and are negative.      Objective:   Physical Exam  Constitutional: She is oriented to person, place, and time. She appears well-developed and well-nourished.  Cardiovascular: Normal rate, regular rhythm and normal heart sounds.   Pulmonary/Chest: Effort normal and breath sounds normal.  Musculoskeletal:  Decrease ROM of lumbar spine due to pain on flexion and extension. No point tenderness (-) SLR bil Motor strength and sensation distally intact  Neurological: She is alert and oriented to person, place, and time.  Skin: Skin is warm.  Psychiatric: She has a normal mood and affect. Her behavior is normal. Judgment and thought content normal.    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 Midline low back pain without sciatica Moist  heat Rest No heavy lifting RTO prn - DG Lumbar Spine 2-3 Views; Future - methylPREDNISolone acetate (DEPO-MEDROL) injection 80 mg; Inject 1 mL (80 mg total) into the muscle once. - predniSONE (DELTASONE) 20 MG tablet; 2 po at sametime daily for 5 days- start tomorrow  Dispense: 10 tablet; Refill: 0  Mary-Margaret Hassell Done, FNP

## 2014-11-04 NOTE — Patient Instructions (Signed)
Back Pain, Adult Low back pain is very common. About 1 in 5 people have back pain.The cause of low back pain is rarely dangerous. The pain often gets better over time.About half of people with a sudden onset of back pain feel better in just 2 weeks. About 8 in 10 people feel better by 6 weeks.  CAUSES Some common causes of back pain include:  Strain of the muscles or ligaments supporting the spine.  Wear and tear (degeneration) of the spinal discs.  Arthritis.  Direct injury to the back. DIAGNOSIS Most of the time, the direct cause of low back pain is not known.However, back pain can be treated effectively even when the exact cause of the pain is unknown.Answering your caregiver's questions about your overall health and symptoms is one of the most accurate ways to make sure the cause of your pain is not dangerous. If your caregiver needs more information, he or she may order lab work or imaging tests (X-rays or MRIs).However, even if imaging tests show changes in your back, this usually does not require surgery. HOME CARE INSTRUCTIONS For many people, back pain returns.Since low back pain is rarely dangerous, it is often a condition that people can learn to manageon their own.   Remain active. It is stressful on the back to sit or stand in one place. Do not sit, drive, or stand in one place for more than 30 minutes at a time. Take short walks on level surfaces as soon as pain allows.Try to increase the length of time you walk each day.  Do not stay in bed.Resting more than 1 or 2 days can delay your recovery.  Do not avoid exercise or work.Your body is made to move.It is not dangerous to be active, even though your back may hurt.Your back will likely heal faster if you return to being active before your pain is gone.  Pay attention to your body when you bend and lift. Many people have less discomfortwhen lifting if they bend their knees, keep the load close to their bodies,and  avoid twisting. Often, the most comfortable positions are those that put less stress on your recovering back.  Find a comfortable position to sleep. Use a firm mattress and lie on your side with your knees slightly bent. If you lie on your back, put a pillow under your knees.  Only take over-the-counter or prescription medicines as directed by your caregiver. Over-the-counter medicines to reduce pain and inflammation are often the most helpful.Your caregiver may prescribe muscle relaxant drugs.These medicines help dull your pain so you can more quickly return to your normal activities and healthy exercise.  Put ice on the injured area.  Put ice in a plastic bag.  Place a towel between your skin and the bag.  Leave the ice on for 15-20 minutes, 03-04 times a day for the first 2 to 3 days. After that, ice and heat may be alternated to reduce pain and spasms.  Ask your caregiver about trying back exercises and gentle massage. This may be of some benefit.  Avoid feeling anxious or stressed.Stress increases muscle tension and can worsen back pain.It is important to recognize when you are anxious or stressed and learn ways to manage it.Exercise is a great option. SEEK MEDICAL CARE IF:  You have pain that is not relieved with rest or medicine.  You have pain that does not improve in 1 week.  You have new symptoms.  You are generally not feeling well. SEEK   IMMEDIATE MEDICAL CARE IF:   You have pain that radiates from your back into your legs.  You develop new bowel or bladder control problems.  You have unusual weakness or numbness in your arms or legs.  You develop nausea or vomiting.  You develop abdominal pain.  You feel faint. Document Released: 05/02/2005 Document Revised: 11/01/2011 Document Reviewed: 09/03/2013 [employer] Patient Information 2015 [employer]. This information is not intended to replace advice given to you by your health care provider. Make sure you  discuss any questions you have with your health care provider.  

## 2014-11-20 ENCOUNTER — Telehealth: Payer: Self-pay | Admitting: Nurse Practitioner

## 2014-11-20 ENCOUNTER — Other Ambulatory Visit: Payer: Self-pay | Admitting: Nurse Practitioner

## 2014-11-20 DIAGNOSIS — M545 Low back pain: Secondary | ICD-10-CM

## 2014-11-20 NOTE — Telephone Encounter (Signed)
Compression area at t12- if still having pain- will order MRI

## 2014-11-20 NOTE — Telephone Encounter (Signed)
MMM spoke with pt regarding MRI Pt verbalizes understanding

## 2014-11-20 NOTE — Telephone Encounter (Signed)
Please review and advise.

## 2014-11-24 ENCOUNTER — Encounter (HOSPITAL_COMMUNITY): Payer: Self-pay | Admitting: Emergency Medicine

## 2014-11-24 ENCOUNTER — Emergency Department (HOSPITAL_COMMUNITY)
Admission: EM | Admit: 2014-11-24 | Discharge: 2014-11-24 | Disposition: A | Discharge status: Home / Self Care | Payer: Medicare Other | Source: Home / Self Care | Attending: Emergency Medicine | Admitting: Emergency Medicine

## 2014-11-24 DIAGNOSIS — Z853 Personal history of malignant neoplasm of breast: Secondary | ICD-10-CM

## 2014-11-24 DIAGNOSIS — S32000D Wedge compression fracture of unspecified lumbar vertebra, subsequent encounter for fracture with routine healing: Secondary | ICD-10-CM

## 2014-11-24 DIAGNOSIS — Z79899 Other long term (current) drug therapy: Secondary | ICD-10-CM

## 2014-11-24 DIAGNOSIS — I10 Essential (primary) hypertension: Secondary | ICD-10-CM

## 2014-11-24 DIAGNOSIS — S22080D Wedge compression fracture of T11-T12 vertebra, subsequent encounter for fracture with routine healing: Secondary | ICD-10-CM

## 2014-11-24 DIAGNOSIS — Z8249 Family history of ischemic heart disease and other diseases of the circulatory system: Secondary | ICD-10-CM

## 2014-11-24 DIAGNOSIS — Z87891 Personal history of nicotine dependence: Secondary | ICD-10-CM

## 2014-11-24 DIAGNOSIS — Z7401 Bed confinement status: Secondary | ICD-10-CM

## 2014-11-24 DIAGNOSIS — E079 Disorder of thyroid, unspecified: Secondary | ICD-10-CM | POA: Diagnosis present

## 2014-11-24 DIAGNOSIS — R531 Weakness: Secondary | ICD-10-CM | POA: Diagnosis not present

## 2014-11-24 DIAGNOSIS — E785 Hyperlipidemia, unspecified: Secondary | ICD-10-CM | POA: Diagnosis present

## 2014-11-24 DIAGNOSIS — M1990 Unspecified osteoarthritis, unspecified site: Secondary | ICD-10-CM | POA: Insufficient documentation

## 2014-11-24 DIAGNOSIS — G893 Neoplasm related pain (acute) (chronic): Secondary | ICD-10-CM | POA: Diagnosis present

## 2014-11-24 DIAGNOSIS — Z9012 Acquired absence of left breast and nipple: Secondary | ICD-10-CM | POA: Diagnosis present

## 2014-11-24 DIAGNOSIS — S32020D Wedge compression fracture of second lumbar vertebra, subsequent encounter for fracture with routine healing: Secondary | ICD-10-CM

## 2014-11-24 DIAGNOSIS — E875 Hyperkalemia: Secondary | ICD-10-CM | POA: Diagnosis present

## 2014-11-24 DIAGNOSIS — G9340 Encephalopathy, unspecified: Secondary | ICD-10-CM | POA: Diagnosis present

## 2014-11-24 DIAGNOSIS — Z809 Family history of malignant neoplasm, unspecified: Secondary | ICD-10-CM

## 2014-11-24 DIAGNOSIS — X58XXXD Exposure to other specified factors, subsequent encounter: Secondary | ICD-10-CM | POA: Insufficient documentation

## 2014-11-24 DIAGNOSIS — Z7952 Long term (current) use of systemic steroids: Secondary | ICD-10-CM

## 2014-11-24 DIAGNOSIS — N179 Acute kidney failure, unspecified: Secondary | ICD-10-CM | POA: Diagnosis present

## 2014-11-24 DIAGNOSIS — C7951 Secondary malignant neoplasm of bone: Secondary | ICD-10-CM | POA: Diagnosis present

## 2014-11-24 DIAGNOSIS — E871 Hypo-osmolality and hyponatremia: Secondary | ICD-10-CM | POA: Diagnosis present

## 2014-11-24 DIAGNOSIS — C787 Secondary malignant neoplasm of liver and intrahepatic bile duct: Secondary | ICD-10-CM | POA: Diagnosis present

## 2014-11-24 DIAGNOSIS — S22000D Wedge compression fracture of unspecified thoracic vertebra, subsequent encounter for fracture with routine healing: Secondary | ICD-10-CM

## 2014-11-24 DIAGNOSIS — E039 Hypothyroidism, unspecified: Secondary | ICD-10-CM | POA: Diagnosis present

## 2014-11-24 HISTORY — DX: Unspecified osteoarthritis, unspecified site: M19.90

## 2014-11-24 LAB — URINE MICROSCOPIC-ADD ON

## 2014-11-24 LAB — URINALYSIS, ROUTINE W REFLEX MICROSCOPIC
Glucose, UA: NEGATIVE mg/dL
Ketones, ur: NEGATIVE mg/dL
Nitrite: NEGATIVE
Protein, ur: NEGATIVE mg/dL
Specific Gravity, Urine: 1.02 (ref 1.005–1.030)
UROBILINOGEN UA: 0.2 mg/dL (ref 0.0–1.0)
pH: 5.5 (ref 5.0–8.0)

## 2014-11-24 MED ORDER — NAPROXEN 375 MG PO TABS: 375.0000 mg | ORAL_TABLET | Freq: Two times a day (BID) | ORAL | Status: DC

## 2014-11-24 MED ORDER — NAPROXEN 250 MG PO TABS
375.0000 mg | ORAL_TABLET | Freq: Once | ORAL | Status: AC
  Administered 2014-11-24: 375 mg via ORAL
  Filled 2014-11-24: qty 2

## 2014-11-24 NOTE — ED Notes (Signed)
Pt states that she has been having lower back pain for several months now.  States that she has been taking Naproxen for pain but that she is out of it.

## 2014-11-24 NOTE — ED Provider Notes (Signed)
CSN: [id number]     Arrival date & time 11/24/14  0946 History   First MD Initiated Contact with Patient 11/24/14 1002     Chief Complaint  Patient presents with  . Back Pain     (Consider location/radiation/quality/duration/timing/severity/associated sxs/prior Treatment) The history is provided by the patient.   Paula Alvarez is a 69 y.o. female with a past medical history of hypertension, hyperlipidemia and thyroid disease and breast cancer in 2011 with a partial mastectomy presenting with a 3 month history of mid to lower back pain.  She endorses lifting a heavy box in April and since then has had pain.  She has taken naproxen which was prescribed her which improved her symptoms greatly.  More recently she was seen by her primary doctor and a prednisone taper was given which she said was less effective.  She has a plain film x-ray suggesting the T12 and L2 have wedge deformities/compression fractures which appear to be subacute.  She is currently waiting for an MRI for further evaluation of these findings.  In the interim she has worsened pain since she has run out of her Naprosyn.  She denies urinary or fecal incontinence or retention, denies dysuria or hematuria.  She has no weakness or numbness in her legs.  Pain is nonradiating and is worsened with movement and certain physicians.     Past Medical History  Diagnosis Date  . Hyperlipidemia   . Hypertension   . Thyroid disease   . Cancer 01/2010    Left breast. Partial mastectomy.  . Arthritis    Past Surgical History  Procedure Laterality Date  . Mastectomy, partial  03/22/2010    left   Family History  Problem Relation Age of Onset  . Cancer Mother     bone  . Heart attack Father    History  Substance Use Topics  . Smoking status: Former Smoker -- 10 years    Quit date: 10/21/2007  . Smokeless tobacco: Never Used  . Alcohol Use: No   OB History    No data available     Review of Systems  Constitutional: Negative  for fever and chills.  Respiratory: Negative for shortness of breath.   Cardiovascular: Negative for chest pain and leg swelling.  Gastrointestinal: Negative for abdominal pain, constipation and abdominal distention.  Genitourinary: Negative for dysuria, urgency, frequency, flank pain and difficulty urinating.  Musculoskeletal: Positive for back pain. Negative for joint swelling and gait problem.  Skin: Negative for rash.  Neurological: Negative for weakness and numbness.      Allergies  Review of patient's allergies indicates no known allergies.  Home Medications   Prior to Admission medications   Medication Sig Start Date End Date Taking? Authorizing Provider  acetaminophen (TYLENOL) 500 MG tablet Take 1,000 mg by mouth every 6 (six) hours as needed for moderate pain.   Yes Historical Provider, MD  amLODipine-benazepril (LOTREL) 5-40 MG per capsule Take 1 capsule by mouth daily. 07/31/14  Yes Mary-Margaret Hassell Done, FNP  levothyroxine (SYNTHROID, LEVOTHROID) 25 MCG tablet Take 1 tablet (25 mcg total) by mouth daily. 07/31/14  Yes Mary-Margaret Hassell Done, FNP  Menthol, Topical Analgesic, (ICY HOT EX) Apply 1 application topically daily as needed (back pain).   Yes Historical Provider, MD  metoprolol tartrate (LOPRESSOR) 25 MG tablet Take 1 tablet (25 mg total) by mouth 2 (two) times daily. 07/31/14  Yes Mary-Margaret Hassell Done, FNP  naproxen (NAPROSYN) 375 MG tablet Take 1 tablet (375 mg total) by mouth 2 (two)  times daily. 11/24/14   Evalee Jefferson, PA-C  predniSONE (DELTASONE) 20 MG tablet 2 po at sametime daily for 5 days- start tomorrow Patient not taking: Reported on 11/24/2014 11/04/14   Mary-Margaret Hassell Done, FNP   BP 99/63 mmHg  Pulse 106  Temp(Src) 97.6 F (36.4 C) (Oral)  Resp 18  Ht 5\' 3"  (1.6 m)  Wt 120 lb (54.432 kg)  BMI 21.26 kg/m2  SpO2 96% Physical Exam  Constitutional: She appears well-developed and well-nourished.  HENT:  Head: Normocephalic.  Eyes: Conjunctivae are normal.   Neck: Normal range of motion. Neck supple.  Cardiovascular: Normal rate and intact distal pulses.   Pedal pulses normal.  Pulmonary/Chest: Effort normal.  Abdominal: Soft. Bowel sounds are normal. She exhibits no distension and no mass.  Musculoskeletal: Normal range of motion. She exhibits no edema.       Lumbar back: She exhibits bony tenderness. She exhibits no swelling, no edema and no spasm.  Patient is tender to palpation midline upper lumbar region along with right paralumbar tenderness.  There is no palpable deformity.  No muscle spasm.  Neurological: She is alert. She has normal strength. She displays no atrophy and no tremor. No sensory deficit. Gait normal.  Reflex Scores:      Patellar reflexes are 2+ on the right side and 2+ on the left side.      Achilles reflexes are 2+ on the right side and 2+ on the left side. No strength deficit noted in hip and knee flexor and extensor muscle groups.  Ankle flexion and extension intact.  Skin: Skin is warm and dry.  Psychiatric: She has a normal mood and affect.  Nursing note and vitals reviewed.   ED Course  Procedures (including critical care time) Labs Review Labs Reviewed  URINALYSIS, ROUTINE W REFLEX MICROSCOPIC (NOT AT [facility]) - Abnormal; Notable for the following:    Hgb urine dipstick TRACE (*)    Bilirubin Urine SMALL (*)    Leukocytes, UA SMALL (*)    All other components within normal limits  URINE MICROSCOPIC-ADD ON - Abnormal; Notable for the following:    Squamous Epithelial / LPF MANY (*)    Bacteria, UA MANY (*)    All other components within normal limits  URINE CULTURE    Imaging Review No results found.   EKG Interpretation None      MDM   Final diagnoses:  Compression fracture of lumbar vertebra, with routine healing, subsequent encounter  Compression fracture of thoracic vertebra, with routine healing, subsequent encounter    Labs reviewed, urine culture sent.  Doubt, will defer treatment  pending culture results.  Patient was placed on naproxen.  Review of chart and discussion with MRI tech here reveals that the MRI has been ordered, still awaiting approval by patient's insurance.  When necessary follow-up anticipated.  She was encouraged to follow-up with her primary doctor for any ongoing problems or concerns with her back.    Evalee Jefferson, PA-C 11/24/14 1734  Elnora Morrison, MD 11/25/14 [phone]

## 2014-11-25 ENCOUNTER — Ambulatory Visit: Payer: Medicare Other | Admitting: Nurse Practitioner

## 2014-11-26 ENCOUNTER — Telehealth: Payer: Self-pay | Admitting: Nurse Practitioner

## 2014-11-26 ENCOUNTER — Emergency Department (HOSPITAL_COMMUNITY): Payer: Medicare Other

## 2014-11-26 ENCOUNTER — Encounter (HOSPITAL_COMMUNITY): Payer: Self-pay | Admitting: Emergency Medicine

## 2014-11-26 ENCOUNTER — Inpatient Hospital Stay (HOSPITAL_COMMUNITY): Payer: Medicare Other

## 2014-11-26 ENCOUNTER — Inpatient Hospital Stay (HOSPITAL_COMMUNITY)
Admission: EM | Admit: 2014-11-26 | Discharge: 2014-11-29 | DRG: 640 | Disposition: A | Discharge status: Hospice | Payer: Medicare Other | Attending: Internal Medicine | Admitting: Internal Medicine

## 2014-11-26 DIAGNOSIS — N179 Acute kidney failure, unspecified: Secondary | ICD-10-CM

## 2014-11-26 DIAGNOSIS — Z87891 Personal history of nicotine dependence: Secondary | ICD-10-CM | POA: Diagnosis not present

## 2014-11-26 DIAGNOSIS — C787 Secondary malignant neoplasm of liver and intrahepatic bile duct: Secondary | ICD-10-CM | POA: Diagnosis present

## 2014-11-26 DIAGNOSIS — G893 Neoplasm related pain (acute) (chronic): Secondary | ICD-10-CM | POA: Diagnosis present

## 2014-11-26 DIAGNOSIS — N19 Unspecified kidney failure: Secondary | ICD-10-CM | POA: Diagnosis not present

## 2014-11-26 DIAGNOSIS — R0600 Dyspnea, unspecified: Secondary | ICD-10-CM | POA: Diagnosis not present

## 2014-11-26 DIAGNOSIS — E079 Disorder of thyroid, unspecified: Secondary | ICD-10-CM | POA: Diagnosis present

## 2014-11-26 DIAGNOSIS — Z853 Personal history of malignant neoplasm of breast: Secondary | ICD-10-CM | POA: Diagnosis not present

## 2014-11-26 DIAGNOSIS — E039 Hypothyroidism, unspecified: Secondary | ICD-10-CM | POA: Diagnosis present

## 2014-11-26 DIAGNOSIS — G9340 Encephalopathy, unspecified: Secondary | ICD-10-CM | POA: Diagnosis present

## 2014-11-26 DIAGNOSIS — E871 Hypo-osmolality and hyponatremia: Secondary | ICD-10-CM | POA: Diagnosis present

## 2014-11-26 DIAGNOSIS — E785 Hyperlipidemia, unspecified: Secondary | ICD-10-CM | POA: Diagnosis present

## 2014-11-26 DIAGNOSIS — C799 Secondary malignant neoplasm of unspecified site: Secondary | ICD-10-CM | POA: Diagnosis present

## 2014-11-26 DIAGNOSIS — K7689 Other specified diseases of liver: Secondary | ICD-10-CM

## 2014-11-26 DIAGNOSIS — Z809 Family history of malignant neoplasm, unspecified: Secondary | ICD-10-CM | POA: Diagnosis not present

## 2014-11-26 DIAGNOSIS — C7951 Secondary malignant neoplasm of bone: Secondary | ICD-10-CM | POA: Diagnosis present

## 2014-11-26 DIAGNOSIS — C44501 Unspecified malignant neoplasm of skin of breast: Secondary | ICD-10-CM | POA: Diagnosis not present

## 2014-11-26 DIAGNOSIS — C801 Malignant (primary) neoplasm, unspecified: Secondary | ICD-10-CM | POA: Diagnosis not present

## 2014-11-26 DIAGNOSIS — I10 Essential (primary) hypertension: Secondary | ICD-10-CM | POA: Diagnosis present

## 2014-11-26 DIAGNOSIS — S32020D Wedge compression fracture of second lumbar vertebra, subsequent encounter for fracture with routine healing: Secondary | ICD-10-CM | POA: Diagnosis not present

## 2014-11-26 DIAGNOSIS — Z7401 Bed confinement status: Secondary | ICD-10-CM | POA: Diagnosis not present

## 2014-11-26 DIAGNOSIS — E875 Hyperkalemia: Secondary | ICD-10-CM | POA: Diagnosis present

## 2014-11-26 DIAGNOSIS — Z7952 Long term (current) use of systemic steroids: Secondary | ICD-10-CM | POA: Diagnosis not present

## 2014-11-26 DIAGNOSIS — R531 Weakness: Secondary | ICD-10-CM | POA: Diagnosis present

## 2014-11-26 DIAGNOSIS — Z9012 Acquired absence of left breast and nipple: Secondary | ICD-10-CM | POA: Diagnosis present

## 2014-11-26 DIAGNOSIS — R4781 Slurred speech: Secondary | ICD-10-CM

## 2014-11-26 DIAGNOSIS — S22080D Wedge compression fracture of T11-T12 vertebra, subsequent encounter for fracture with routine healing: Secondary | ICD-10-CM | POA: Diagnosis not present

## 2014-11-26 DIAGNOSIS — R630 Anorexia: Secondary | ICD-10-CM | POA: Diagnosis not present

## 2014-11-26 DIAGNOSIS — R945 Abnormal results of liver function studies: Secondary | ICD-10-CM

## 2014-11-26 DIAGNOSIS — Z8249 Family history of ischemic heart disease and other diseases of the circulatory system: Secondary | ICD-10-CM | POA: Diagnosis not present

## 2014-11-26 LAB — COMPREHENSIVE METABOLIC PANEL
ALK PHOS: 445 U/L — AB (ref 38–126)
ALT: 30 U/L (ref 14–54)
AST: 212 U/L — AB (ref 15–41)
Albumin: 2.8 g/dL — ABNORMAL LOW (ref 3.5–5.0)
Anion gap: 18 — ABNORMAL HIGH (ref 5–15)
BILIRUBIN TOTAL: 1 mg/dL (ref 0.3–1.2)
BUN: 63 mg/dL — AB (ref 6–20)
CHLORIDE: 99 mmol/L — AB (ref 101–111)
CO2: 17 mmol/L — ABNORMAL LOW (ref 22–32)
CREATININE: 2.2 mg/dL — AB (ref 0.44–1.00)
Calcium: >15 mg/dL (ref 8.9–10.3)
GFR calc Af Amer: 25 mL/min — ABNORMAL LOW (ref >60)
GFR, EST NON AFRICAN AMERICAN: 22 mL/min — AB (ref >60)
Glucose, Bld: 103 mg/dL — ABNORMAL HIGH (ref 65–99)
Potassium: 5.5 mmol/L — ABNORMAL HIGH (ref 3.5–5.1)
Sodium: 134 mmol/L — ABNORMAL LOW (ref 135–145)
Total Protein: 8.9 g/dL — ABNORMAL HIGH (ref 6.5–8.1)

## 2014-11-26 LAB — CBC WITH DIFFERENTIAL/PLATELET
Basophils Absolute: 0 10*3/uL (ref 0.0–0.1)
Basophils Relative: 0 % (ref 0–1)
Eosinophils Absolute: 0 10*3/uL (ref 0.0–0.7)
Eosinophils Relative: 0 % (ref 0–5)
HCT: 35.8 % — ABNORMAL LOW (ref 36.0–46.0)
Hemoglobin: 12.4 g/dL (ref 12.0–15.0)
LYMPHS ABS: 1.4 10*3/uL (ref 0.7–4.0)
LYMPHS PCT: 18 % (ref 12–46)
MCH: 32.6 pg (ref 26.0–34.0)
MCHC: 34.6 g/dL (ref 30.0–36.0)
MCV: 94.2 fL (ref 78.0–100.0)
Monocytes Absolute: 0.7 10*3/uL (ref 0.1–1.0)
Monocytes Relative: 9 % (ref 3–12)
Neutro Abs: 5.7 10*3/uL (ref 1.7–7.7)
Neutrophils Relative %: 73 % (ref 43–77)
Platelets: 216 10*3/uL (ref 150–400)
RBC: 3.8 MIL/uL — ABNORMAL LOW (ref 3.87–5.11)
RDW: 16.1 % — ABNORMAL HIGH (ref 11.5–15.5)
WBC: 7.7 10*3/uL (ref 4.0–10.5)

## 2014-11-26 LAB — URINE CULTURE: Special Requests: NORMAL

## 2014-11-26 LAB — TROPONIN I

## 2014-11-26 MED ORDER — SODIUM CHLORIDE 0.9 % IV SOLN
Freq: Once | INTRAVENOUS | Status: AC
  Administered 2014-11-26: 22:00:00 via INTRAVENOUS

## 2014-11-26 MED ORDER — CALCITONIN (SALMON) 200 UNIT/ML IJ SOLN
200.0000 [IU] | Freq: Once | INTRAMUSCULAR | Status: AC
  Administered 2014-11-26: 200 [IU] via INTRAMUSCULAR
  Filled 2014-11-26: qty 1

## 2014-11-26 MED ORDER — AMLODIPINE BESY-BENAZEPRIL HCL 5-40 MG PO CAPS: 1.0000 | ORAL_CAPSULE | Freq: Every day | ORAL | Status: DC

## 2014-11-26 MED ORDER — FUROSEMIDE 10 MG/ML IJ SOLN
20.0000 mg | Freq: Once | INTRAMUSCULAR | Status: AC
  Administered 2014-11-27: 20 mg via INTRAVENOUS
  Filled 2014-11-26: qty 2

## 2014-11-26 MED ORDER — PAMIDRONATE DISODIUM 30 MG IV SOLR
INTRAVENOUS | Status: AC
  Filled 2014-11-26: qty 90

## 2014-11-26 MED ORDER — LEVOTHYROXINE SODIUM 25 MCG PO TABS
25.0000 ug | ORAL_TABLET | Freq: Every day | ORAL | Status: DC
  Administered 2014-11-27 – 2014-11-29 (×3): 25 ug via ORAL
  Filled 2014-11-26 (×3): qty 1

## 2014-11-26 MED ORDER — AMLODIPINE BESYLATE 5 MG PO TABS: 5.0000 mg | ORAL_TABLET | Freq: Every day | ORAL | Status: DC

## 2014-11-26 MED ORDER — METOPROLOL TARTRATE 25 MG PO TABS
25.0000 mg | ORAL_TABLET | Freq: Two times a day (BID) | ORAL | Status: DC
  Administered 2014-11-27 – 2014-11-29 (×6): 25 mg via ORAL
  Filled 2014-11-26 (×6): qty 1

## 2014-11-26 MED ORDER — ENOXAPARIN SODIUM 30 MG/0.3ML ~~LOC~~ SOLN
30.0000 mg | SUBCUTANEOUS | Status: DC
  Administered 2014-11-27: 30 mg via SUBCUTANEOUS
  Filled 2014-11-26 (×2): qty 0.3

## 2014-11-26 MED ORDER — SODIUM CHLORIDE 0.9 % IV SOLN: INTRAVENOUS | Status: AC

## 2014-11-26 MED ORDER — SODIUM BICARBONATE 8.4 % IV SOLN
50.0000 meq | Freq: Once | INTRAVENOUS | Status: AC
  Administered 2014-11-27: 50 meq via INTRAVENOUS
  Filled 2014-11-26: qty 50

## 2014-11-26 MED ORDER — BENAZEPRIL HCL 10 MG PO TABS: 40.0000 mg | ORAL_TABLET | Freq: Every day | ORAL | Status: DC

## 2014-11-26 MED ORDER — ACETAMINOPHEN 325 MG PO TABS
650.0000 mg | ORAL_TABLET | Freq: Four times a day (QID) | ORAL | Status: DC | PRN
Start: 2014-11-26 — End: 2014-11-29

## 2014-11-26 MED ORDER — HYDRALAZINE HCL 20 MG/ML IJ SOLN
10.0000 mg | Freq: Four times a day (QID) | INTRAMUSCULAR | Status: DC | PRN
Start: 2014-11-26 — End: 2014-11-29

## 2014-11-26 MED ORDER — SODIUM POLYSTYRENE SULFONATE 15 GM/60ML PO SUSP
30.0000 g | Freq: Once | ORAL | Status: AC
  Administered 2014-11-27: 30 g via ORAL
  Filled 2014-11-26: qty 120

## 2014-11-26 MED ORDER — SODIUM CHLORIDE 0.9 % IV SOLN: INTRAVENOUS | Status: DC

## 2014-11-26 MED ORDER — ACETAMINOPHEN 650 MG RE SUPP
650.0000 mg | Freq: Four times a day (QID) | RECTAL | Status: DC | PRN
Start: 2014-11-26 — End: 2014-11-29

## 2014-11-26 MED ORDER — SODIUM CHLORIDE 0.9 % IV SOLN
90.0000 mg | Freq: Once | INTRAVENOUS | Status: AC
  Administered 2014-11-26: 90 mg via INTRAVENOUS
  Filled 2014-11-26: qty 10

## 2014-11-26 MED ORDER — SODIUM CHLORIDE 0.9 % IJ SOLN
3.0000 mL | Freq: Two times a day (BID) | INTRAMUSCULAR | Status: DC
  Administered 2014-11-27 – 2014-11-28 (×3): 3 mL via INTRAVENOUS

## 2014-11-26 MED ORDER — SODIUM CHLORIDE 0.9 % IV BOLUS (SEPSIS)
1000.0000 mL | Freq: Once | INTRAVENOUS | Status: AC
  Administered 2014-11-26: 1000 mL via INTRAVENOUS

## 2014-11-26 NOTE — ED Provider Notes (Signed)
CSN: [id number]     Arrival date & time 11/26/14  1934 History  This chart was scribed for Nat Christen, MD by Julien Nordmann, ED Scribe. This patient was seen in room APA07/APA07 and the patient's care was started at 8:22 PM.    Chief Complaint  Patient presents with  . Cough    LEVEL 5 CAVEAT FOR ACUITY AND CONFUSION  The history is provided by a relative. No language interpreter was used.   HPI Comments: Paula Alvarez is a 69 y.o. female who presents to the Emergency Department complaining of constant, gradual worsening cough onset a few days ago. She has had associated shortness of breath and slight confusion according to her daughter. Per daughter, her mother has been having trouble talking. She was seen on 7/11 for gradual worsening lower back pain onset about 3 months ago. She notes her mother is usually an active person at baseline. Per daughter, pt has not been eating and drinking normally. Pt has been taking OTC tylenol to alleviate the pain. She has a prior hx of a left mastectomy 5 years ago. She denies abdominal pain and injury to back.  Past Medical History  Diagnosis Date  . Hyperlipidemia   . Hypertension   . Thyroid disease   . Cancer 01/2010    Left breast. Partial mastectomy.  . Arthritis    Past Surgical History  Procedure Laterality Date  . Mastectomy, partial  03/22/2010    left   Family History  Problem Relation Age of Onset  . Cancer Mother     bone  . Heart attack Father    History  Substance Use Topics  . Smoking status: Former Smoker -- 10 years    Quit date: 10/21/2007  . Smokeless tobacco: Never Used  . Alcohol Use: No   OB History    No data available     Review of Systems  A complete 10 system review of systems was obtained and all systems are negative except as noted in the HPI and PMH.    Allergies  Review of patient's allergies indicates no known allergies.  Home Medications   Prior to Admission medications   Medication Sig Start  Date End Date Taking? Authorizing Provider  amLODipine-benazepril (LOTREL) 5-40 MG per capsule Take 1 capsule by mouth daily. 07/31/14  Yes Mary-Margaret Hassell Done, FNP  levothyroxine (SYNTHROID, LEVOTHROID) 25 MCG tablet Take 1 tablet (25 mcg total) by mouth daily. 07/31/14  Yes Mary-Margaret Hassell Done, FNP  metoprolol tartrate (LOPRESSOR) 25 MG tablet Take 1 tablet (25 mg total) by mouth 2 (two) times daily. 07/31/14  Yes Mary-Margaret Hassell Done, FNP  acetaminophen (TYLENOL) 500 MG tablet Take 1,000 mg by mouth every 6 (six) hours as needed for moderate pain.    Historical Provider, MD  Menthol, Topical Analgesic, (ICY HOT EX) Apply 1 application topically daily as needed (back pain).    Historical Provider, MD  naproxen (NAPROSYN) 375 MG tablet Take 1 tablet (375 mg total) by mouth 2 (two) times daily. Patient not taking: Reported on 11/26/2014 11/24/14   Evalee Jefferson, PA-C  predniSONE (DELTASONE) 20 MG tablet 2 po at sametime daily for 5 days- start tomorrow Patient taking differently: Take 40 mg by mouth daily. 2 po at sametime daily for 5 days- start tomorrow 11/04/14   Mary-Margaret Hassell Done, FNP   Triage vitals: BP 92/56 mmHg  Pulse 116  Temp(Src) 97.5 F (36.4 C) (Oral)  Resp 16  Ht 5\' 3"  (1.6 m)  Wt   SpO2 95%  Physical Exam  Constitutional: She is oriented to person, place, and time. She appears well-developed and well-nourished.  HENT:  Head: Normocephalic and atraumatic.  Eyes: Conjunctivae and EOM are normal. Pupils are equal, round, and reactive to light.  Neck: Normal range of motion. Neck supple.  Cardiovascular: Regular rhythm.  Tachycardia present.   Slightly dyspneic and tachypneic  Pulmonary/Chest: Effort normal and breath sounds normal.  Abdominal: Soft. Bowel sounds are normal.  Musculoskeletal: Normal range of motion.  Neurological: She is alert and oriented to person, place, and time.  Skin: Skin is warm and dry.  Psychiatric:  Flat affect, slightly confused  Nursing note and  vitals reviewed.   ED Course  Procedures  DIAGNOSTIC STUDIES: Oxygen Saturation is 95% on RA, adequate by my interpretation.  COORDINATION OF CARE: 8:27 PM Discussed treatment plan which includes x-ray of lungs, blood work, and IV fluids with pt at bedside and pt agreed to plan.   Labs Review Labs Reviewed  COMPREHENSIVE METABOLIC PANEL - Abnormal; Notable for the following:    Sodium 134 (*)    Potassium 5.5 (*)    Chloride 99 (*)    CO2 17 (*)    Glucose, Bld 103 (*)    BUN 63 (*)    Creatinine, Ser 2.20 (*)    Calcium >15.0 (*)    Total Protein 8.9 (*)    Albumin 2.8 (*)    AST 212 (*)    Alkaline Phosphatase 445 (*)    GFR calc non Af Amer 22 (*)    GFR calc Af Amer 25 (*)    Anion gap 18 (*)    All other components within normal limits  CBC WITH DIFFERENTIAL/PLATELET - Abnormal; Notable for the following:    RBC 3.80 (*)    HCT 35.8 (*)    RDW 16.1 (*)    All other components within normal limits  TROPONIN I  PARATHYROID HORMONE, INTACT (NO CA)    Imaging Review Dg Chest 2 View  11/26/2014   CLINICAL DATA:  Shortness of breath and cough  EXAM: CHEST - 2 VIEW  COMPARISON:  03/10/2010  FINDINGS: Cardiac shadow is stable. Mild vascular congestion with interstitial edema is seen. Bilateral small effusions are seen with likely underlying atelectasis. No bony abnormality is noted. Postsurgical changes in the left axilla are seen.  IMPRESSION: Changes consistent with mild CHF.   Electronically Signed   By: Inez Catalina M.D.   On: 11/26/2014 21:37     EKG Interpretation   Date/Time:  Wednesday November 26 2014 20:52:32 EDT Ventricular Rate:  98 PR Interval:  139 QRS Duration: 79 QT Interval:  304 QTC Calculation: 388 R Axis:   65 Text Interpretation:  Sinus rhythm Confirmed by Lacinda Axon  MD, Earlie Arciga ([id number]) on  11/26/2014 9:04:43 PM     CRITICAL CARE Performed by: Nat Christen  ?  Total critical care time: 30  Critical care time was exclusive of separately billable  procedures and treating other patients.  Critical care was necessary to treat or prevent imminent or life-threatening deterioration.  Critical care was time spent personally by me on the following activities: development of treatment plan with patient and/or surrogate as well as nursing, discussions with consultants, evaluation of patient's response to treatment, examination of patient, obtaining history from patient or surrogate, ordering and performing treatments and interventions, ordering and review of laboratory studies, ordering and review of radiographic studies, pulse oximetry and re-evaluation of patient's condition. MDM   Final diagnoses:  Hypercalcemia  Renal failure    Uncertain etiology of hypercalcemia. We will vigorously hydrate.  Rx calcitonin and pamidronate IV. Admit to general medicine.  Nat Christen, MD 11/26/14 2209

## 2014-11-26 NOTE — ED Notes (Signed)
CRITICAL VALUE ALERT  Critical value received:  Calcium 15.7  Date of notification:  11/26/2014  Time of notification:  2122  Critical value read back:Yes.    Nurse who received alert:  BKN  MD notified (1st page):  Lacinda Axon  Time of first page:  2123  MD notified (2nd page):  Time of second page:  Responding MD:    Time MD responded:

## 2014-11-26 NOTE — ED Notes (Addendum)
Pt c/o sob for a while, back pain, and cough. Per daughter she is not eating well and is weak. Per EDPA the pt's daughter called earlier to state that she was upset about her mother's previous visit.

## 2014-11-26 NOTE — H&P (Signed)
Zella Dewan is an 69 y.o. female.     Chief Complaint: weakness HPI: 69 yo female with htn, hyperlipidemia, hypothyroidism, hx of L breast cancer apparently c/o weakness worse since Monday.  Pt has had polyuria.  Pt also notes weight loss, and also slight cough and dyspnea.  Cough is nonproductive, and pt denies fever, chills, cp, palp , orthopnea, pnd, wt gain, lower ext edema.  Pt was brought to ED and noted to have hypercalcemia, and ARF, and hyperkalemia.    Past Medical History  Diagnosis Date  . Hyperlipidemia   . Hypertension   . Thyroid disease   . Cancer 01/2010    Left breast. Partial mastectomy.  . Arthritis     Past Surgical History  Procedure Laterality Date  . Mastectomy, partial  03/22/2010    left    Family History  Problem Relation Age of Onset  . Cancer Mother     bone  . Heart attack Father    Social History:  reports that she quit smoking about 7 years ago. She has never used smokeless tobacco. She reports that she does not drink alcohol or use illicit drugs.  Allergies: No Known Allergies  Medications Prior to Admission  Medication Sig Dispense Refill  . amLODipine-benazepril (LOTREL) 5-40 MG per capsule Take 1 capsule by mouth daily. 90 capsule 1  . levothyroxine (SYNTHROID, LEVOTHROID) 25 MCG tablet Take 1 tablet (25 mcg total) by mouth daily. 90 tablet 1  . metoprolol tartrate (LOPRESSOR) 25 MG tablet Take 1 tablet (25 mg total) by mouth 2 (two) times daily. 180 tablet 1  . acetaminophen (TYLENOL) 500 MG tablet Take 1,000 mg by mouth every 6 (six) hours as needed for moderate pain.    . Menthol, Topical Analgesic, (ICY HOT EX) Apply 1 application topically daily as needed (back pain).    . naproxen (NAPROSYN) 375 MG tablet Take 1 tablet (375 mg total) by mouth 2 (two) times daily. (Patient not taking: Reported on 11/26/2014) 30 tablet 0  . predniSONE (DELTASONE) 20 MG tablet 2 po at sametime daily for 5 days- start tomorrow (Patient taking differently:  Take 40 mg by mouth daily. 2 po at sametime daily for 5 days- start tomorrow) 10 tablet 0    Results for orders placed or performed during the hospital encounter of 11/26/14 (from the past 48 hour(s))  Comprehensive metabolic panel     Status: Abnormal   Collection Time: 11/26/14  8:30 PM  Result Value Ref Range   Sodium 134 (L) 135 - 145 mmol/L   Potassium 5.5 (H) 3.5 - 5.1 mmol/L   Chloride 99 (L) 101 - 111 mmol/L   CO2 17 (L) 22 - 32 mmol/L   Glucose, Bld 103 (H) 65 - 99 mg/dL   BUN 63 (H) 6 - 20 mg/dL   Creatinine, Ser 2.20 (H) 0.44 - 1.00 mg/dL   Calcium >15.0 (HH) 8.9 - 10.3 mg/dL    Comment: RESULT REPEATED AND VERIFIED CRITICAL RESULT CALLED TO, READ BACK BY AND VERIFIED WITH: NORMAN,B AT 2124 ON 11/26/14 BY [email or web address]    Total Protein 8.9 (H) 6.5 - 8.1 g/dL   Albumin 2.8 (L) 3.5 - 5.0 g/dL   AST 212 (H) 15 - 41 U/L   ALT 30 14 - 54 U/L   Alkaline Phosphatase 445 (H) 38 - 126 U/L   Total Bilirubin 1.0 0.3 - 1.2 mg/dL   GFR calc non Af Amer 22 (L) >60 mL/min   GFR calc Af Amer 25 (  L) >60 mL/min    Comment: (NOTE) The eGFR has been calculated using the CKD EPI equation. This calculation has not been validated in all clinical situations. eGFR's persistently <60 mL/min signify possible Chronic Kidney Disease.    Anion gap 18 (H) 5 - 15  Troponin I     Status: None   Collection Time: 11/26/14  8:30 PM  Result Value Ref Range   Troponin I <0.03 <0.031 ng/mL    Comment:        NO INDICATION OF MYOCARDIAL INJURY.   CBC with Differential     Status: Abnormal   Collection Time: 11/26/14  8:30 PM  Result Value Ref Range   WBC 7.7 4.0 - 10.5 K/uL   RBC 3.80 (L) 3.87 - 5.11 MIL/uL   Hemoglobin 12.4 12.0 - 15.0 g/dL   HCT 35.8 (L) 36.0 - 46.0 %   MCV 94.2 78.0 - 100.0 fL   MCH 32.6 26.0 - 34.0 pg   MCHC 34.6 30.0 - 36.0 g/dL   RDW 16.1 (H) 11.5 - 15.5 %   Platelets 216 150 - 400 K/uL   Neutrophils Relative % 73 43 - 77 %   Neutro Abs 5.7 1.7 - 7.7 K/uL   Lymphocytes  Relative 18 12 - 46 %   Lymphs Abs 1.4 0.7 - 4.0 K/uL   Monocytes Relative 9 3 - 12 %   Monocytes Absolute 0.7 0.1 - 1.0 K/uL   Eosinophils Relative 0 0 - 5 %   Eosinophils Absolute 0.0 0.0 - 0.7 K/uL   Basophils Relative 0 0 - 1 %   Basophils Absolute 0.0 0.0 - 0.1 K/uL   Dg Chest 2 View  11/26/2014   CLINICAL DATA:  Shortness of breath and cough  EXAM: CHEST - 2 VIEW  COMPARISON:  03/10/2010  FINDINGS: Cardiac shadow is stable. Mild vascular congestion with interstitial edema is seen. Bilateral small effusions are seen with likely underlying atelectasis. No bony abnormality is noted. Postsurgical changes in the left axilla are seen.  IMPRESSION: Changes consistent with mild CHF.   Electronically Signed   By: Inez Catalina M.D.   On: 11/26/2014 21:37   Ct Head Wo Contrast  11/26/2014   CLINICAL DATA:  Initial evaluation for slurred speech, shortness of breath.  EXAM: CT HEAD WITHOUT CONTRAST  TECHNIQUE: Contiguous axial images were obtained from the base of the skull through the vertex without intravenous contrast.  COMPARISON:  None.  FINDINGS: There is no acute intracranial hemorrhage or infarct. No mass lesion or midline shift. Gray-white matter differentiation is well maintained. Ventricles are normal in size without evidence of hydrocephalus. CSF containing spaces are within normal limits. No extra-axial fluid collection. Mild atrophy with chronic microvascular ischemic disease. Vascular calcifications present within the carotid siphons.  The calvarium is intact.  Orbital soft tissues are within normal limits.  The paranasal sinuses and mastoid air cells are well pneumatized and free of fluid.  Scalp soft tissues are unremarkable.  IMPRESSION: 1. No acute intracranial process. 2. Mild atrophy with chronic microvascular ischemic disease. 3. Intracranial atherosclerosis.   Electronically Signed   By: Jeannine Boga M.D.   On: 11/26/2014 22:37    Review of Systems  Constitutional: Positive  for weight loss. Negative for fever, chills, malaise/fatigue and diaphoresis.  HENT: Negative.   Eyes: Negative.   Respiratory: Positive for cough and shortness of breath. Negative for hemoptysis, sputum production and wheezing.   Cardiovascular: Negative.   Gastrointestinal: Negative.   Genitourinary: Negative.  Musculoskeletal: Positive for back pain. Negative for myalgias, joint pain, falls and neck pain.  Skin: Negative for itching and rash.  Neurological: Negative.  Negative for weakness.  Endo/Heme/Allergies: Negative.   Psychiatric/Behavioral: Negative.     Blood pressure 123/63, pulse 92, temperature 97.6 F (36.4 C), temperature source Oral, resp. rate 25, height 5' 3"  (1.6 m), SpO2 93 %. Physical Exam  Constitutional: She is oriented to person, place, and time. She appears well-developed and well-nourished.  HENT:  Head: Normocephalic and atraumatic.  Mouth/Throat: No oropharyngeal exudate.  Eyes: Conjunctivae and EOM are normal. Pupils are equal, round, and reactive to light. No scleral icterus.  Neck: Normal range of motion. Neck supple. No JVD present. No tracheal deviation present. No thyromegaly present.  Cardiovascular: Normal rate and regular rhythm.  Exam reveals no gallop and no friction rub.   No murmur heard. Respiratory: Effort normal and breath sounds normal. No respiratory distress. She has no wheezes. She has no rales.  GI: Soft. Bowel sounds are normal. She exhibits no distension. There is no tenderness. There is no rebound and no guarding.  Musculoskeletal: Normal range of motion. She exhibits no edema or tenderness.  Lymphadenopathy:    She has no cervical adenopathy.  Neurological: She is alert and oriented to person, place, and time. She has normal reflexes. She displays normal reflexes. No cranial nerve deficit. She exhibits normal muscle tone. Coordination normal.  Skin: Skin is warm and dry. No rash noted. No erythema. No pallor.  Psychiatric: She has  a normal mood and affect. Her behavior is normal. Judgment and thought content normal.     Assessment/Plan Hypercalcemia Check vitamin D, tsh, mag, phos, pth, pth rp, spep, upep Ns iv Lasix iv Pamidronate as ordered by ED.   ARF Check urine sodium, urine creatinine, urine eosinophils Urinalysis Hydrate with  Ns iv  Hyperkalemia Kayexalate D/c lotrel  Hypertension Hydralazine 80m iv q6h prn sbp >160 Cont metoprolol  Wt loss/ Abnormal lft Check acute hepatitis panel Check cpk, mb Check CT scan chest, abd/ pelvis Check tsh  CHF ? On CXR Clinically doubt chf Check CT chest  DVT prophylaxis: scd, lovenox KJani Gravel7/13/2016, 11:20 PM

## 2014-11-27 ENCOUNTER — Inpatient Hospital Stay (HOSPITAL_BASED_OUTPATIENT_CLINIC_OR_DEPARTMENT_OTHER): Payer: Medicare Other

## 2014-11-27 DIAGNOSIS — R630 Anorexia: Secondary | ICD-10-CM

## 2014-11-27 DIAGNOSIS — E871 Hypo-osmolality and hyponatremia: Secondary | ICD-10-CM

## 2014-11-27 DIAGNOSIS — C799 Secondary malignant neoplasm of unspecified site: Secondary | ICD-10-CM | POA: Diagnosis present

## 2014-11-27 DIAGNOSIS — E875 Hyperkalemia: Secondary | ICD-10-CM

## 2014-11-27 DIAGNOSIS — Z87891 Personal history of nicotine dependence: Secondary | ICD-10-CM

## 2014-11-27 DIAGNOSIS — N179 Acute kidney failure, unspecified: Secondary | ICD-10-CM

## 2014-11-27 DIAGNOSIS — C7951 Secondary malignant neoplasm of bone: Secondary | ICD-10-CM

## 2014-11-27 DIAGNOSIS — R0600 Dyspnea, unspecified: Secondary | ICD-10-CM | POA: Diagnosis not present

## 2014-11-27 DIAGNOSIS — C787 Secondary malignant neoplasm of liver and intrahepatic bile duct: Secondary | ICD-10-CM

## 2014-11-27 DIAGNOSIS — M545 Low back pain: Secondary | ICD-10-CM

## 2014-11-27 DIAGNOSIS — C44501 Unspecified malignant neoplasm of skin of breast: Secondary | ICD-10-CM

## 2014-11-27 LAB — COMPREHENSIVE METABOLIC PANEL
ALT: 28 U/L (ref 14–54)
ANION GAP: 15 (ref 5–15)
AST: 194 U/L — ABNORMAL HIGH (ref 15–41)
Albumin: 2.7 g/dL — ABNORMAL LOW (ref 3.5–5.0)
Alkaline Phosphatase: 450 U/L — ABNORMAL HIGH (ref 38–126)
BILIRUBIN TOTAL: 0.8 mg/dL (ref 0.3–1.2)
BUN: 51 mg/dL — AB (ref 6–20)
CO2: 21 mmol/L — AB (ref 22–32)
CREATININE: 1.49 mg/dL — AB (ref 0.44–1.00)
Calcium: 13.3 mg/dL (ref 8.9–10.3)
Chloride: 104 mmol/L (ref 101–111)
GFR calc Af Amer: 40 mL/min — ABNORMAL LOW (ref >60)
GFR calc non Af Amer: 35 mL/min — ABNORMAL LOW (ref >60)
Glucose, Bld: 132 mg/dL — ABNORMAL HIGH (ref 65–99)
Potassium: 4.3 mmol/L (ref 3.5–5.1)
Sodium: 140 mmol/L (ref 135–145)
Total Protein: 8.4 g/dL — ABNORMAL HIGH (ref 6.5–8.1)

## 2014-11-27 LAB — CBC
HEMATOCRIT: 35.5 % — AB (ref 36.0–46.0)
HEMOGLOBIN: 11.9 g/dL — AB (ref 12.0–15.0)
MCH: 32.2 pg (ref 26.0–34.0)
MCHC: 33.5 g/dL (ref 30.0–36.0)
MCV: 95.9 fL (ref 78.0–100.0)
Platelets: 235 10*3/uL (ref 150–400)
RBC: 3.7 MIL/uL — ABNORMAL LOW (ref 3.87–5.11)
RDW: 16.1 % — AB (ref 11.5–15.5)
WBC: 7.3 10*3/uL (ref 4.0–10.5)

## 2014-11-27 LAB — PHOSPHORUS: Phosphorus: 6.7 mg/dL — ABNORMAL HIGH (ref 2.5–4.6)

## 2014-11-27 LAB — BRAIN NATRIURETIC PEPTIDE: B Natriuretic Peptide: 44 pg/mL (ref 0.0–100.0)

## 2014-11-27 LAB — CK TOTAL AND CKMB (NOT AT ARMC)
CK, MB: 1.4 ng/mL (ref 0.5–5.0)
Relative Index: 1.3 (ref 0.0–2.5)
Total CK: 107 U/L (ref 38–234)

## 2014-11-27 LAB — MAGNESIUM: MAGNESIUM: 2.6 mg/dL — AB (ref 1.7–2.4)

## 2014-11-27 LAB — GAMMA GT: GGT: 492 U/L — ABNORMAL HIGH (ref 7–50)

## 2014-11-27 LAB — MRSA PCR SCREENING: MRSA by PCR: NEGATIVE

## 2014-11-27 LAB — TSH: TSH: 3.974 u[IU]/mL (ref 0.350–4.500)

## 2014-11-27 MED ORDER — FUROSEMIDE 10 MG/ML IJ SOLN
20.0000 mg | Freq: Once | INTRAMUSCULAR | Status: AC
  Administered 2014-11-27: 20 mg via INTRAVENOUS
  Filled 2014-11-27: qty 2

## 2014-11-27 MED ORDER — SODIUM CHLORIDE 0.9 % IV SOLN
INTRAVENOUS | Status: DC
  Administered 2014-11-27: 06:00:00 via INTRAVENOUS

## 2014-11-27 MED ORDER — MORPHINE SULFATE 2 MG/ML IJ SOLN
2.0000 mg | INTRAMUSCULAR | Status: DC | PRN
  Administered 2014-11-27 – 2014-11-28 (×2): 2 mg via INTRAVENOUS
  Filled 2014-11-27 (×2): qty 1

## 2014-11-27 MED ORDER — SODIUM CHLORIDE 0.9 % IV SOLN
60.0000 mg | Freq: Once | INTRAVENOUS | Status: AC
  Administered 2014-11-27: 60 mg via INTRAVENOUS
  Filled 2014-11-27: qty 20

## 2014-11-27 MED ORDER — SODIUM CHLORIDE 0.9 % IV SOLN
INTRAVENOUS | Status: DC
  Administered 2014-11-27: 20:00:00 via INTRAVENOUS

## 2014-11-27 MED ORDER — PAMIDRONATE DISODIUM 30 MG IV SOLR
INTRAVENOUS | Status: AC
  Filled 2014-11-27: qty 60

## 2014-11-27 NOTE — Consult Note (Signed)
Inpatient Hematology/Oncology Consultation   Name: Paula Alvarez      MRN: [id number]    Location: C003/K917-91  Date: 11/27/2014 Time:4:20 PM   REFERRING PHYSICIAN: Dr. Cruzita Lederer  REASON FOR CONSULT:   Metastatic breast cancer     Hypercalcemia   DIAGNOSIS:  Metastatic breast cancer   Hypercalcemia   Back pain   Anorexia   Poor PS  HISTORY OF PRESENT ILLNESS:  69 year old female with known history of breast cancer originally diagnosed in 2008. She underwent a lumpectomy and sentinel node biopsy with Dr. Brantley Stage. In 2008 and refused additional therapy. Her tumor was Strongly ER+, and HER-2 neu negative. She declined any endocrine therapy.  Her cancer recurred in 2010 and she underwent lumpectomy. She had positive margins but refused re-excision. She then had another recurrence and underwent mastectomy in 2011.  Family notes that patient is very private. She did however report back pain that began in April.   She presented to the ED at [facility] with weakness, weight loss, anorexia. She was noted on evaluation to have a calcium of great than 15 mg/dl, ARF with a creatinine of 2.2.  CT imaging showed bony metastatic disease and diffuse hepatic metastatic disease. She has been hydrated with an improvement in her calcium and renal function.  We are consulted for further recommendations. The patient is a poor historian. Most of the history was obtained from family and [facility].   PAST MEDICAL HISTORY:   Past Medical History  Diagnosis Date  . Hyperlipidemia   . Hypertension   . Thyroid disease   . Cancer 01/2010    Left breast. Partial mastectomy.  . Arthritis     ALLERGIES: No Known Allergies    MEDICATIONS: I have reviewed the patient's current medications.     PAST SURGICAL HISTORY Past Surgical History  Procedure Laterality Date  . Mastectomy, partial  03/22/2010    left    FAMILY HISTORY: Family History  Problem Relation Age of Onset  . Cancer Mother     bone  . Heart  attack Father     SOCIAL HISTORY:  reports that she quit smoking about 7 years ago. She has never used smokeless tobacco. She reports that she does not drink alcohol or use illicit drugs.  PERFORMANCE STATUS: The patient's performance status is 3 - Symptomatic, >50% confined to bed  Review of Systems  Constitutional: Positive for weight loss and malaise/fatigue.  HENT: Negative.   Eyes: Negative.   Respiratory: Positive for shortness of breath.   Cardiovascular: Negative.   Gastrointestinal: Positive for abdominal pain.  Genitourinary: Negative.   Musculoskeletal: Positive for back pain.  Skin: Negative.   Neurological: Positive for sensory change and weakness.       Confusion  Endo/Heme/Allergies: Negative.   Psychiatric/Behavioral: The patient has insomnia.   14 point review of systems was performed and is negative except as detailed under history of present illness and above   PHYSICAL EXAMINATION  ECOG PERFORMANCE STATUS: 3 - Symptomatic, >50% confined to bed  Filed Vitals:   11/27/14 1600  BP:   Pulse: 105  Temp:   Resp: 28    Physical Exam  Constitutional: She appears distressed.  HENT:  Head: Normocephalic.  Mouth/Throat: Oropharynx is clear and moist. No oropharyngeal exudate.  Eyes: Pupils are equal, round, and reactive to light. Right eye exhibits no discharge. Left eye exhibits no discharge. No scleral icterus.  Neck: Normal range of motion. No thyromegaly present.  Cardiovascular: Normal  rate, regular rhythm and normal heart sounds.   Pulmonary/Chest: No stridor. She has no wheezes. She has no rales. She exhibits no tenderness.  Appears tachypneic  Abdominal: Soft.  RUQ tenderness with palpation. Mild hepatomegaly  Musculoskeletal: She exhibits no edema or tenderness.  Lymphadenopathy:    She has no cervical adenopathy.  Neurological: She is alert.  Somewhat confused, not particularly talkative  Skin: Skin is warm and dry. She is not diaphoretic.    Psychiatric:  Affect is flat, memory is intact    LABORATORY DATA:  Results for orders placed or performed during the hospital encounter of 11/26/14 (from the past 48 hour(s))  Comprehensive metabolic panel     Status: Abnormal   Collection Time: 11/26/14  8:30 PM  Result Value Ref Range   Sodium 134 (L) 135 - 145 mmol/L   Potassium 5.5 (H) 3.5 - 5.1 mmol/L   Chloride 99 (L) 101 - 111 mmol/L   CO2 17 (L) 22 - 32 mmol/L   Glucose, Bld 103 (H) 65 - 99 mg/dL   BUN 63 (H) 6 - 20 mg/dL   Creatinine, Ser 2.20 (H) 0.44 - 1.00 mg/dL   Calcium >15.0 (HH) 8.9 - 10.3 mg/dL    Comment: RESULT REPEATED AND VERIFIED CRITICAL RESULT CALLED TO, READ BACK BY AND VERIFIED WITH: NORMAN,B AT 2124 ON 11/26/14 BY ISLEY.B    Total Protein 8.9 (H) 6.5 - 8.1 g/dL   Albumin 2.8 (L) 3.5 - 5.0 g/dL   AST 212 (H) 15 - 41 U/L   ALT 30 14 - 54 U/L   Alkaline Phosphatase 445 (H) 38 - 126 U/L   Total Bilirubin 1.0 0.3 - 1.2 mg/dL   GFR calc non Af Amer 22 (L) >60 mL/min   GFR calc Af Amer 25 (L) >60 mL/min    Comment: (NOTE) The eGFR has been calculated using the CKD EPI equation. This calculation has not been validated in all clinical situations. eGFR's persistently <60 mL/min signify possible Chronic Kidney Disease.    Anion gap 18 (H) 5 - 15  Troponin I     Status: None   Collection Time: 11/26/14  8:30 PM  Result Value Ref Range   Troponin I <0.03 <0.031 ng/mL    Comment:        NO INDICATION OF MYOCARDIAL INJURY.   CBC with Differential     Status: Abnormal   Collection Time: 11/26/14  8:30 PM  Result Value Ref Range   WBC 7.7 4.0 - 10.5 K/uL   RBC 3.80 (L) 3.87 - 5.11 MIL/uL   Hemoglobin 12.4 12.0 - 15.0 g/dL   HCT 35.8 (L) 36.0 - 46.0 %   MCV 94.2 78.0 - 100.0 fL   MCH 32.6 26.0 - 34.0 pg   MCHC 34.6 30.0 - 36.0 g/dL   RDW 16.1 (H) 11.5 - 15.5 %   Platelets 216 150 - 400 K/uL   Neutrophils Relative % 73 43 - 77 %   Neutro Abs 5.7 1.7 - 7.7 K/uL   Lymphocytes Relative 18 12 - 46 %    Lymphs Abs 1.4 0.7 - 4.0 K/uL   Monocytes Relative 9 3 - 12 %   Monocytes Absolute 0.7 0.1 - 1.0 K/uL   Eosinophils Relative 0 0 - 5 %   Eosinophils Absolute 0.0 0.0 - 0.7 K/uL   Basophils Relative 0 0 - 1 %   Basophils Absolute 0.0 0.0 - 0.1 K/uL  CK total and CKMB (cardiac)not at [facility]  Status: None   Collection Time: 11/26/14  8:30 PM  Result Value Ref Range   Total CK 107 38 - 234 U/L   CK, MB 1.4 0.5 - 5.0 ng/mL   Relative Index 1.3 0.0 - 2.5    Comment: Performed at [facility]     Status: Abnormal   Collection Time: 11/26/14  8:30 PM  Result Value Ref Range   GGT 492 (H) 7 - 50 U/L    Comment: Performed at [facility]  Brain natriuretic peptide     Status: None   Collection Time: 11/26/14  8:30 PM  Result Value Ref Range   B Natriuretic Peptide 44.0 0.0 - 100.0 pg/mL  Magnesium     Status: Abnormal   Collection Time: 11/26/14  8:30 PM  Result Value Ref Range   Magnesium 2.6 (H) 1.7 - 2.4 mg/dL  Phosphorus     Status: Abnormal   Collection Time: 11/26/14  8:30 PM  Result Value Ref Range   Phosphorus 6.7 (H) 2.5 - 4.6 mg/dL  TSH     Status: None   Collection Time: 11/26/14  8:30 PM  Result Value Ref Range   TSH 3.974 0.350 - 4.500 uIU/mL  MRSA PCR Screening     Status: None   Collection Time: 11/26/14 11:34 PM  Result Value Ref Range   MRSA by PCR NEGATIVE NEGATIVE    Comment:        The GeneXpert MRSA Assay (FDA approved for NASAL specimens only), is one component of a comprehensive MRSA colonization surveillance program. It is not intended to diagnose MRSA infection nor to guide or monitor treatment for MRSA infections.   CBC     Status: Abnormal   Collection Time: 11/27/14  4:38 AM  Result Value Ref Range   WBC 7.3 4.0 - 10.5 K/uL   RBC 3.70 (L) 3.87 - 5.11 MIL/uL   Hemoglobin 11.9 (L) 12.0 - 15.0 g/dL   HCT 35.5 (L) 36.0 - 46.0 %   MCV 95.9 78.0 - 100.0 fL   MCH 32.2 26.0 - 34.0 pg   MCHC 33.5 30.0 - 36.0 g/dL   RDW 16.1  (H) 11.5 - 15.5 %   Platelets 235 150 - 400 K/uL  Comprehensive metabolic panel     Status: Abnormal   Collection Time: 11/27/14  4:38 AM  Result Value Ref Range   Sodium 140 135 - 145 mmol/L   Potassium 4.3 3.5 - 5.1 mmol/L    Comment: DELTA CHECK NOTED   Chloride 104 101 - 111 mmol/L   CO2 21 (L) 22 - 32 mmol/L   Glucose, Bld 132 (H) 65 - 99 mg/dL   BUN 51 (H) 6 - 20 mg/dL   Creatinine, Ser 1.49 (H) 0.44 - 1.00 mg/dL   Calcium 13.3 (HH) 8.9 - 10.3 mg/dL    Comment: CRITICAL RESULT CALLED TO, READ BACK BY AND VERIFIED WITH: HEARN,J AT 6:05AM ON 11/27/14 BY FESTERMAN,C    Total Protein 8.4 (H) 6.5 - 8.1 g/dL   Albumin 2.7 (L) 3.5 - 5.0 g/dL   AST 194 (H) 15 - 41 U/L   ALT 28 14 - 54 U/L   Alkaline Phosphatase 450 (H) 38 - 126 U/L   Total Bilirubin 0.8 0.3 - 1.2 mg/dL   GFR calc non Af Amer 35 (L) >60 mL/min   GFR calc Af Amer 40 (L) >60 mL/min    Comment: (NOTE) The eGFR has been calculated using the CKD  EPI equation. This calculation has not been validated in all clinical situations. eGFR's persistently <60 mL/min signify possible Chronic Kidney Disease.    Anion gap 15 5 - 15      RADIOGRAPHY: Ct Abdomen Pelvis Wo Contrast  11/27/2014   CLINICAL DATA:  Shortness of breath and altered mental status with elevated liver enzymes. History of left breast cancer. No IV contrast due to elevated creatinine.  EXAM: CT CHEST, ABDOMEN AND PELVIS WITHOUT CONTRAST  TECHNIQUE: Multidetector CT imaging of the chest, abdomen and pelvis was performed following the standard protocol without IV contrast.  COMPARISON:  None.  FINDINGS: CT CHEST FINDINGS  Normal heart size. Normal caliber thoracic aorta. Aortic calcifications. Scattered lymph nodes demonstrated in the mediastinum are not abnormally enlarged. Borderline prominence of lymph nodes in the right axilla, measuring up to 7 mm short axis dimension. These are likely to be reactive. Postoperative changes with left mastectomy and surgical clips  in the left axilla. Esophagus is decompressed.  Evaluation of lungs is limited due to severe respiratory motion artifact. There are moderate size bilateral pleural effusions with atelectasis in both lung bases. Circumscribed nodular opacity in the right middle lung measuring 4 mm diameter. Six-month follow-up study suggested. No pneumothorax.  CT ABDOMEN AND PELVIS FINDINGS  The liver is diffusely enlarged with nodular contour and multiple vague low-attenuation changes throughout the liver. This is likely due to metastatic disease. If the patient cannot have IV contrast material, referral for elective MRI may be useful. The unenhanced appearance of the gallbladder, pancreas, spleen, inferior vena cava are unremarkable. 2.4 cm lesion in the upper pole left kidney with low-attenuation suggesting a cyst. No hydronephrosis in either kidney. Calcification of abdominal aorta without aneurysm. No adrenal gland nodules. Mild prominence of periaortic retroperitoneal lymph nodes at 7 mm. These are indeterminate but early metastasis cannot be excluded. Stomach and small bowel are decompressed. Colon is not abnormally distended. No free air or free fluid in the abdomen.  Pelvis: Diverticulosis of the sigmoid colon. No evidence of diverticulitis. Bladder wall is not thickened. Due to should ovaries are not enlarged. Appendix is normal. No free or loculated pelvic fluid collections.  Bones: Diffuse heterogeneous sclerotic and lytic changes throughout the thoracic, lumbar, and sacral spine as well as in the sternum and pelvis and within multiple bilateral ribs consistent with diffuse bone metastasis.  IMPRESSION: Moderate bilateral pleural effusions with bilateral basilar atelectasis. Borderline lymph nodes in the mediastinum and right axilla are indeterminate. 4 mm nodule in the right middle lung.  Diffuse metastases throughout the liver.  Diffuse bone metastases.   Electronically Signed   By: Lucienne Capers M.D.   On:  11/27/2014 00:59   Dg Chest 2 View  11/26/2014   CLINICAL DATA:  Shortness of breath and cough  EXAM: CHEST - 2 VIEW  COMPARISON:  03/10/2010  FINDINGS: Cardiac shadow is stable. Mild vascular congestion with interstitial edema is seen. Bilateral small effusions are seen with likely underlying atelectasis. No bony abnormality is noted. Postsurgical changes in the left axilla are seen.  IMPRESSION: Changes consistent with mild CHF.   Electronically Signed   By: Inez Catalina M.D.   On: 11/26/2014 21:37   Ct Head Wo Contrast  11/26/2014   CLINICAL DATA:  Initial evaluation for slurred speech, shortness of breath.  EXAM: CT HEAD WITHOUT CONTRAST  TECHNIQUE: Contiguous axial images were obtained from the base of the skull through the vertex without intravenous contrast.  COMPARISON:  None.  FINDINGS: There  is no acute intracranial hemorrhage or infarct. No mass lesion or midline shift. Gray-white matter differentiation is well maintained. Ventricles are normal in size without evidence of hydrocephalus. CSF containing spaces are within normal limits. No extra-axial fluid collection. Mild atrophy with chronic microvascular ischemic disease. Vascular calcifications present within the carotid siphons.  The calvarium is intact.  Orbital soft tissues are within normal limits.  The paranasal sinuses and mastoid air cells are well pneumatized and free of fluid.  Scalp soft tissues are unremarkable.  IMPRESSION: 1. No acute intracranial process. 2. Mild atrophy with chronic microvascular ischemic disease. 3. Intracranial atherosclerosis.   Electronically Signed   By: Jeannine Boga M.D.   On: 11/26/2014 22:37   Ct Chest Wo Contrast  11/27/2014   CLINICAL DATA:  Shortness of breath and altered mental status with elevated liver enzymes. History of left breast cancer. No IV contrast due to elevated creatinine.  EXAM: CT CHEST, ABDOMEN AND PELVIS WITHOUT CONTRAST  TECHNIQUE: Multidetector CT imaging of the chest,  abdomen and pelvis was performed following the standard protocol without IV contrast.  COMPARISON:  None.  FINDINGS: CT CHEST FINDINGS  Normal heart size. Normal caliber thoracic aorta. Aortic calcifications. Scattered lymph nodes demonstrated in the mediastinum are not abnormally enlarged. Borderline prominence of lymph nodes in the right axilla, measuring up to 7 mm short axis dimension. These are likely to be reactive. Postoperative changes with left mastectomy and surgical clips in the left axilla. Esophagus is decompressed.  Evaluation of lungs is limited due to severe respiratory motion artifact. There are moderate size bilateral pleural effusions with atelectasis in both lung bases. Circumscribed nodular opacity in the right middle lung measuring 4 mm diameter. Six-month follow-up study suggested. No pneumothorax.  CT ABDOMEN AND PELVIS FINDINGS  The liver is diffusely enlarged with nodular contour and multiple vague low-attenuation changes throughout the liver. This is likely due to metastatic disease. If the patient cannot have IV contrast material, referral for elective MRI may be useful. The unenhanced appearance of the gallbladder, pancreas, spleen, inferior vena cava are unremarkable. 2.4 cm lesion in the upper pole left kidney with low-attenuation suggesting a cyst. No hydronephrosis in either kidney. Calcification of abdominal aorta without aneurysm. No adrenal gland nodules. Mild prominence of periaortic retroperitoneal lymph nodes at 7 mm. These are indeterminate but early metastasis cannot be excluded. Stomach and small bowel are decompressed. Colon is not abnormally distended. No free air or free fluid in the abdomen.  Pelvis: Diverticulosis of the sigmoid colon. No evidence of diverticulitis. Bladder wall is not thickened. Due to should ovaries are not enlarged. Appendix is normal. No free or loculated pelvic fluid collections.  Bones: Diffuse heterogeneous sclerotic and lytic changes throughout  the thoracic, lumbar, and sacral spine as well as in the sternum and pelvis and within multiple bilateral ribs consistent with diffuse bone metastasis.  IMPRESSION: Moderate bilateral pleural effusions with bilateral basilar atelectasis. Borderline lymph nodes in the mediastinum and right axilla are indeterminate. 4 mm nodule in the right middle lung.  Diffuse metastases throughout the liver.  Diffuse bone metastases.   Electronically Signed   By: Lucienne Capers M.D.   On: 11/27/2014 00:59       PATHOLOGY:    FINAL DIAGNOSIS  MICROSCOPIC EXAMINATION AND DIAGNOSIS  1. LEFT BREAST, WIRE/NEEDLE LOCALIZED LUMPECTOMY: - INVASIVE DUCTAL CARCINOMA, 0.7 CM, NOTTINGHAM COMBINED HISTOLOGIC SCORE I, EXTENDING THE INKED ANTERIOR MARGIN. 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  2. BREAST, LEFT ADDITIONAL MEDIAL MARGIN, EXCISION: - INVASIVE DUCTAL CARCINOMA (0.1 CM).  PLEASE SEE ONCOLOGY TEMPLATE FOR DETAILS  COMMENT ONCOLOGY TABLE BREAST, WITHOUT LYMPH NODES  Specimen, including laterality: Left breast. Procedure: Wire/needle localized lumpectomy. Specimen integrity (intact/fragmented): Intact. Tumor size (cm): At least 0.7 cm Margins: Invasive component, distance to closest margin: Anterior, positive (R1). In situ component, distance to closest margin: posterior/deep margin; positive Lymph - Vascular invasion: Present. Histologic type, invasive component: Invasive ductal carcinoma. Grade, Invasive component (Nottingham combined histologic score): I Tubule formation grade: 1 Nuclear pleomorphism grade: 2 Mitotic grade: 1 Overall grade: 4/9 Ductal carcinoma in situ: Present. Nuclear grade: Intermediate grade. Necrosis: Present. Extensive intraductal component: No. Lobular carcinoma in situ component: N/A Treatment effect (if treated with neoadjuvant therapy): N/A Multicentric (separate  tumors in different quadrants): N/A Multifocal (separate tumors in same quadrant or biopsy): No. Macroscopic or microscopic extent of tumor: Skin: N/A Nipple: N/A Skeletal muscle: N/A TNM code: r pT1b , pNX , pMX Breast Prognostic Markers: A breast prognostic profile will be performed and an addendum report will follow. Non-neoplastic breast: Previous biopsy site changes. Comments: There is a 0.7cm recurrent well differentiated invasive ductal carcinoma identified. Tumor extends to the inked anterior margin (green ink). In addition, intermediate grade ductal carcinoma in situ with associated tumor necrosis is also present and extends to the inked posterior/deep margin. Focal angiolymphatic invasion is present. In addition, there is microscopic focus of invasive ductal carcinoma present in the left additional medial margin which is 0.4 cm from the final inked medial margin. Breast prognostic profile will be performed on a represent paraffin block and a separate report will follow. The case was discussed with Dr. Brantley Stage on 01/12/09. (HCL:gt, 01/12/09)  Results IMMUNOHISTOCHEMICAL AND MORPHOMETRIC ANALYSIS BY THE AUTOMATED CELLULAR IMAGING SYSTEM (ACIS)  Estrogen Receptor (Negative, <1%): 99%, POSITIVE Progesterone Receptor (Negative, <1%): 0%, NEGATIVE Proliferation Marker Ki67 by MIB-1 (Low =20%): 47% All controls stained appropriately.  Interpretation THIS INVASIVE TUMOR IS POSITIVE FOR ESTROGEN RECEPTOR EXPRESSION AND NEGATIVE FOR PROGESTERONE RECEPTOR EXPRESSION. THE INVASIVE TUMOR DEMONSTRATES AN INCREASED PROLIFERATION RATE.  COMMENT: THE NEGATIVE HORMONE RECEPTOR STUDY(IES) IN THIS CASE HAVE AN INTERNAL POSITIVE CONTROL.  DATE REPORTED: 01/14/2009 Lennox Solders. Lyndon Code, MD Electronically Signed Out By: Raynelle Fanning  Interpretation HER-2/NEU BY CISH -- NO AMPLIFICATION OF HER-2 DETECTED. THE RATIO OF HER-2: CEP 17 SIGNALS WAS 1.09.  Controls  stained appropriately.  ASSESSMENT:  Metastatic breast cancer Hypercalcemia Back pain Anorexia Poor PS  PLAN:   Calcium and renal function are improved but she still remains quite hypercalcemic. (Corrected calcium 14.4) I am going to give her pamidronate, which should also help to some degree with her back pain as well.  Family and patient are concerned with comfort. We had a frank discussion about hospice care and goals of hospice care. We discussed the patients prognosis, ie. She is terminal from her disease. We discussed a biopsy, they are not interested in pursuing at this point. Even if her calcium were corrected they report her disability over the last few months is significant. She is not a good candidate for systemic chemotherapy. In addition, she has refused therapy for her breast cancer in the past and family feels she would not even be interested in discussing it.  I briefly addressed this with the patient and she did not wish to discuss. They had brought up a transfer to [facility], after further discussion they were no longer interested.  I offered a transfer to [facility], they currently feel  comfortable with pain control and hospice referral.  I have started morphine IV 2 mg q 2 hrs. Will check in the am for dosing used overnight and start on a fentanyl patch with morphine for breakthrough.   Hospice referral has been placed. She does qualify for hospice house and at this point since she lives alone family is interested in this option.   Total time spent in direct patient and family consultation and advisement and chart review was 120 minutes.  All questions were answered. The patient knows to call the clinic with any problems, questions or concerns. We can certainly see the patient much sooner if necessary. This note was electronically signed. Molli Hazard MD

## 2014-11-27 NOTE — Progress Notes (Signed)
PROGRESS NOTE  Paula Alvarez QTM:[id number] DOB: [birth year] DOA: 11/26/2014 PCP: Chevis Pretty, FNP  HPI: 69 yo female with htn, hyperlipidemia, hypothyroidism, hx of L breast cancer apparently c/o weakness worse since Monday. Pt has had polyuria. Pt also notes weight loss, and also slight cough and dyspnea. Cough is nonproductive, and pt denies fever, chills, cp, palp , orthopnea, pnd, wt gain, lower ext edema. Pt was brought to ED and noted to have hypercalcemia, and ARF, and hyperkalemia.   Subjective / 24 H Interval events - patient with slight confusion, complains of cough - no chest pain, abdominal pain, nausea/vomiting  Assessment/Plan: Active Problems:   Hypertension   Hypothyroidism   Hypercalcemia   Hyperkalemia   ARF (acute renal failure)   Abnormal liver function   Hyponatremia   Hypercalcemia - with significant elevation of her Ca level to > 15, suspicious for malignancy.  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 - patient with known history of metastatic breast cancer, in remission since apparently 2011 - oncology consulted, appreciate input re further workup, likely needs a biopsy  Dyspnea / cough - CXR with slight congestion, will obtain a 2D echo - closely monitor respiratory status while receiving fluids, will decrease the rate and provide additional Lasix  AKI - likely multifactorial, also with poor po intake, improving with fluids  Acute encephalopathy - in the setting of severe hypercalcemia - if will not improve with Ca correction will likely need brain MRI  Hypothyroidism - TSH normal, continue synthroid  HTN - continue home Metoprolol  Elevated liver enzymes - alk phos, which is likely bone, AST elevation - in  the setting of metastatic disease  Hyponatremia - resolved with hydration  Hyperkalemia - in the setting of AKI, improved after Lasix and hydration   Diet:   regular Fluids: NS DVT Prophylaxis: Lovenox  Code Status: Full Code Family Communication: d/w daughter bedside  Disposition Plan: remain inpatient, tx to telemetry   Consultants:  Oncology   Procedures:  None    Antibiotics  Anti-infectives    None       Studies  Ct Abdomen Pelvis Wo Contrast  11/27/2014   CLINICAL DATA:  Shortness of breath and altered mental status with elevated liver enzymes. History of left breast cancer. No IV contrast due to elevated creatinine.  EXAM: CT CHEST, ABDOMEN AND PELVIS WITHOUT CONTRAST  TECHNIQUE: Multidetector CT imaging of the chest, abdomen and pelvis was performed following the standard protocol without IV contrast.  COMPARISON:  None.  FINDINGS: CT CHEST FINDINGS  Normal heart size. Normal caliber thoracic aorta. Aortic calcifications. Scattered lymph nodes demonstrated in the mediastinum are not abnormally enlarged. Borderline prominence of lymph nodes in the right axilla, measuring up to 7 mm short axis dimension. These are likely to be reactive. Postoperative changes with left mastectomy and surgical clips in the left axilla. Esophagus is decompressed.  Evaluation of lungs is limited due to severe respiratory motion artifact. There are moderate size bilateral pleural effusions with atelectasis in both lung bases. Circumscribed nodular opacity in the right middle lung measuring 4 mm diameter. Six-month follow-up study suggested. No pneumothorax.  CT ABDOMEN AND PELVIS FINDINGS  The liver is diffusely enlarged with nodular contour and multiple vague low-attenuation changes throughout the liver. This is  likely due to metastatic disease. If the patient cannot have IV contrast material, referral for elective MRI may be useful. The unenhanced appearance of the gallbladder, pancreas,  spleen, inferior vena cava are unremarkable. 2.4 cm lesion in the upper pole left kidney with low-attenuation suggesting a cyst. No hydronephrosis in either kidney. Calcification of abdominal aorta without aneurysm. No adrenal gland nodules. Mild prominence of periaortic retroperitoneal lymph nodes at 7 mm. These are indeterminate but early metastasis cannot be excluded. Stomach and small bowel are decompressed. Colon is not abnormally distended. No free air or free fluid in the abdomen.  Pelvis: Diverticulosis of the sigmoid colon. No evidence of diverticulitis. Bladder wall is not thickened. Due to should ovaries are not enlarged. Appendix is normal. No free or loculated pelvic fluid collections.  Bones: Diffuse heterogeneous sclerotic and lytic changes throughout the thoracic, lumbar, and sacral spine as well as in the sternum and pelvis and within multiple bilateral ribs consistent with diffuse bone metastasis.  IMPRESSION: Moderate bilateral pleural effusions with bilateral basilar atelectasis. Borderline lymph nodes in the mediastinum and right axilla are indeterminate. 4 mm nodule in the right middle lung.  Diffuse metastases throughout the liver.  Diffuse bone metastases.   Electronically Signed   By: Lucienne Capers M.D.   On: 11/27/2014 00:59   Dg Chest 2 View  11/26/2014   CLINICAL DATA:  Shortness of breath and cough  EXAM: CHEST - 2 VIEW  COMPARISON:  03/10/2010  FINDINGS: Cardiac shadow is stable. Mild vascular congestion with interstitial edema is seen. Bilateral small effusions are seen with likely underlying atelectasis. No bony abnormality is noted. Postsurgical changes in the left axilla are seen.  IMPRESSION: Changes consistent with mild CHF.   Electronically Signed   By: Inez Catalina M.D.   On: 11/26/2014 21:37   Ct Head Wo Contrast  11/26/2014   CLINICAL DATA:  Initial evaluation for slurred speech, shortness of breath.  EXAM: CT HEAD WITHOUT CONTRAST  TECHNIQUE: Contiguous axial images  were obtained from the base of the skull through the vertex without intravenous contrast.  COMPARISON:  None.  FINDINGS: There is no acute intracranial hemorrhage or infarct. No mass lesion or midline shift. Gray-white matter differentiation is well maintained. Ventricles are normal in size without evidence of hydrocephalus. CSF containing spaces are within normal limits. No extra-axial fluid collection. Mild atrophy with chronic microvascular ischemic disease. Vascular calcifications present within the carotid siphons.  The calvarium is intact.  Orbital soft tissues are within normal limits.  The paranasal sinuses and mastoid air cells are well pneumatized and free of fluid.  Scalp soft tissues are unremarkable.  IMPRESSION: 1. No acute intracranial process. 2. Mild atrophy with chronic microvascular ischemic disease. 3. Intracranial atherosclerosis.   Electronically Signed   By: Jeannine Boga M.D.   On: 11/26/2014 22:37   Ct Chest Wo Contrast  11/27/2014   CLINICAL DATA:  Shortness of breath and altered mental status with elevated liver enzymes. History of left breast cancer. No IV contrast due to elevated creatinine.  EXAM: CT CHEST, ABDOMEN AND PELVIS WITHOUT CONTRAST  TECHNIQUE: Multidetector CT imaging of the chest, abdomen and pelvis was performed following the standard protocol without IV contrast.  COMPARISON:  None.  FINDINGS: CT CHEST FINDINGS  Normal heart size. Normal caliber thoracic aorta. Aortic calcifications. Scattered lymph nodes demonstrated in the mediastinum are not abnormally enlarged. Borderline prominence of lymph nodes in the right axilla, measuring up to 7 mm short axis dimension. These are likely  to be reactive. Postoperative changes with left mastectomy and surgical clips in the left axilla. Esophagus is decompressed.  Evaluation of lungs is limited due to severe respiratory motion artifact. There are moderate size bilateral pleural effusions with atelectasis in both lung  bases. Circumscribed nodular opacity in the right middle lung measuring 4 mm diameter. Six-month follow-up study suggested. No pneumothorax.  CT ABDOMEN AND PELVIS FINDINGS  The liver is diffusely enlarged with nodular contour and multiple vague low-attenuation changes throughout the liver. This is likely due to metastatic disease. If the patient cannot have IV contrast material, referral for elective MRI may be useful. The unenhanced appearance of the gallbladder, pancreas, spleen, inferior vena cava are unremarkable. 2.4 cm lesion in the upper pole left kidney with low-attenuation suggesting a cyst. No hydronephrosis in either kidney. Calcification of abdominal aorta without aneurysm. No adrenal gland nodules. Mild prominence of periaortic retroperitoneal lymph nodes at 7 mm. These are indeterminate but early metastasis cannot be excluded. Stomach and small bowel are decompressed. Colon is not abnormally distended. No free air or free fluid in the abdomen.  Pelvis: Diverticulosis of the sigmoid colon. No evidence of diverticulitis. Bladder wall is not thickened. Due to should ovaries are not enlarged. Appendix is normal. No free or loculated pelvic fluid collections.  Bones: Diffuse heterogeneous sclerotic and lytic changes throughout the thoracic, lumbar, and sacral spine as well as in the sternum and pelvis and within multiple bilateral ribs consistent with diffuse bone metastasis.  IMPRESSION: Moderate bilateral pleural effusions with bilateral basilar atelectasis. Borderline lymph nodes in the mediastinum and right axilla are indeterminate. 4 mm nodule in the right middle lung.  Diffuse metastases throughout the liver.  Diffuse bone metastases.   Electronically Signed   By: Lucienne Capers M.D.   On: 11/27/2014 00:59    Objective  Filed Vitals:   11/27/14 0700 11/27/14 0800 11/27/14 0900 11/27/14 1000  BP: 116/59 117/61 111/72 124/59  Pulse: 92 95 90 81  Temp:  96.9 F (36.1 C)    TempSrc:   Axillary    Resp: 28 27 36 27  Height:      Weight:      SpO2: 97% 95% 93% 91%    Intake/Output Summary (Last 24 hours) at 11/27/14 1152 Last data filed at 11/27/14 2248  Gross per 24 hour  Intake 1637.5 ml  Output      0 ml  Net 1637.5 ml   Filed Weights   11/26/14 2345 11/27/14 0500  Weight: 55.1 kg (121 lb 7.6 oz) 55.9 kg (123 lb 3.8 oz)    Exam:  General:  NAD, alert, to person, place, but not time, mild confusion  HEENT: no scleral icterus, PERRL  Cardiovascular: RRR without MRG, 2+ peripheral pulses, no edema  Respiratory: CTA biL, good air movement, no wheezing, no crackles, no rales  Abdomen: soft, non tender, BS +, no guarding  MSK/Extremities: no clubbing/cyanosis, no joint swelling  Skin: no rashes  Neuro: non focal, strength 5/5 in all 4   Data Reviewed: Basic Metabolic Panel:  Recent Labs Lab 11/26/14 2030 11/27/14 0438  NA 134* 140  K 5.5* 4.3  CL 99* 104  CO2 17* 21*  GLUCOSE 103* 132*  BUN 63* 51*  CREATININE 2.20* 1.49*  CALCIUM >15.0* 13.3*  MG 2.6*  --   PHOS 6.7*  --    Liver Function Tests:  Recent Labs Lab 11/26/14 2030 11/27/14 0438  AST 212* 194*  ALT 30 28  ALKPHOS 445* 450*  BILITOT 1.0 0.8  PROT 8.9* 8.4*  ALBUMIN 2.8* 2.7*   CBC:  Recent Labs Lab 11/26/14 2030 11/27/14 0438  WBC 7.7 7.3  NEUTROABS 5.7  --   HGB 12.4 11.9*  HCT 35.8* 35.5*  MCV 94.2 95.9  PLT 216 235   Cardiac Enzymes:  Recent Labs Lab 11/26/14 2030  TROPONINI <0.03   BNP (last 3 results)  Recent Labs  11/26/14 2030  BNP 44.0    Recent Results (from the past 240 hour(s))  Urine culture     Status: None   Collection Time: 11/24/14 11:01 AM  Result Value Ref Range Status   Specimen Description URINE, CLEAN CATCH  Final   Special Requests Normal  Final   Culture   Final    MULTIPLE SPECIES PRESENT, SUGGEST RECOLLECTION IF CLINICALLY INDICATED Performed at [facility]    Report Status 11/26/2014 FINAL  Final  MRSA  PCR Screening     Status: None   Collection Time: 11/26/14 11:34 PM  Result Value Ref Range Status   MRSA by PCR NEGATIVE NEGATIVE Final    Comment:        The GeneXpert MRSA Assay (FDA approved for NASAL specimens only), is one component of a comprehensive MRSA colonization surveillance program. It is not intended to diagnose MRSA infection nor to guide or monitor treatment for MRSA infections.      Scheduled Meds: . enoxaparin (LOVENOX) injection  30 mg Subcutaneous Q24H  . levothyroxine  25 mcg Oral QAC breakfast  . metoprolol tartrate  25 mg Oral BID  . sodium chloride  3 mL Intravenous Q12H   Continuous Infusions: . sodium chloride 150 mL/hr at 11/27/14 0616   Time spent: 35 minutes  Marzetta Board, MD [facility] Pager [phone]. If 7 PM - 7 AM, please contact night-coverage at [email or web address], password [facility] 11/27/2014, 11:52 AM  LOS: 1 day

## 2014-11-27 NOTE — Progress Notes (Signed)
Report called to L.Bullins, Therapist, sports. Pt to be transferred to room 331 via wheelchair. Family has been made aware of transfer.

## 2014-11-28 DIAGNOSIS — C801 Malignant (primary) neoplasm, unspecified: Secondary | ICD-10-CM

## 2014-11-28 DIAGNOSIS — G893 Neoplasm related pain (acute) (chronic): Secondary | ICD-10-CM

## 2014-11-28 LAB — COMPREHENSIVE METABOLIC PANEL
ALT: 28 U/L (ref 14–54)
ANION GAP: 13 (ref 5–15)
AST: 174 U/L — AB (ref 15–41)
Albumin: 2.3 g/dL — ABNORMAL LOW (ref 3.5–5.0)
Alkaline Phosphatase: 424 U/L — ABNORMAL HIGH (ref 38–126)
BILIRUBIN TOTAL: 0.9 mg/dL (ref 0.3–1.2)
BUN: 29 mg/dL — ABNORMAL HIGH (ref 6–20)
CALCIUM: 11.3 mg/dL — AB (ref 8.9–10.3)
CHLORIDE: 112 mmol/L — AB (ref 101–111)
CO2: 21 mmol/L — AB (ref 22–32)
Creatinine, Ser: 0.83 mg/dL (ref 0.44–1.00)
GFR calc Af Amer: >60 mL/min (ref >60)
GFR calc non Af Amer: >60 mL/min (ref >60)
GLUCOSE: 89 mg/dL (ref 65–99)
POTASSIUM: 3.2 mmol/L — AB (ref 3.5–5.1)
SODIUM: 146 mmol/L — AB (ref 135–145)
Total Protein: 7.4 g/dL (ref 6.5–8.1)

## 2014-11-28 LAB — PROTEIN ELECTROPHORESIS, SERUM
A/G RATIO SPE: 0.5 — AB (ref 0.7–1.7)
ALBUMIN ELP: 2.2 g/dL — AB (ref 2.9–4.4)
Alpha-1-Globulin: 0.7 g/dL — ABNORMAL HIGH (ref 0.0–0.4)
Alpha-2-Globulin: 1.3 g/dL — ABNORMAL HIGH (ref 0.4–1.0)
Beta Globulin: 1.6 g/dL — ABNORMAL HIGH (ref 0.7–1.3)
GAMMA GLOBULIN: 0.9 g/dL (ref 0.4–1.8)
GLOBULIN, TOTAL: 4.5 g/dL — AB (ref 2.2–3.9)
Total Protein ELP: 6.7 g/dL (ref 6.0–8.5)

## 2014-11-28 LAB — CBC
HCT: 33.3 % — ABNORMAL LOW (ref 36.0–46.0)
Hemoglobin: 11.1 g/dL — ABNORMAL LOW (ref 12.0–15.0)
MCH: 32.2 pg (ref 26.0–34.0)
MCHC: 33.3 g/dL (ref 30.0–36.0)
MCV: 96.5 fL (ref 78.0–100.0)
Platelets: 179 10*3/uL (ref 150–400)
RBC: 3.45 MIL/uL — AB (ref 3.87–5.11)
RDW: 16.3 % — AB (ref 11.5–15.5)
WBC: 5.4 10*3/uL (ref 4.0–10.5)

## 2014-11-28 LAB — HEPATITIS PANEL, ACUTE
HCV Ab: <0.1 s/co ratio (ref 0.0–0.9)
HEP B C IGM: NEGATIVE
HEP B S AG: NEGATIVE
Hep A IgM: NEGATIVE

## 2014-11-28 LAB — PARATHYROID HORMONE, INTACT (NO CA): PTH: 16 pg/mL (ref 15–65)

## 2014-11-28 LAB — VITAMIN D 25 HYDROXY (VIT D DEFICIENCY, FRACTURES): Vit D, 25-Hydroxy: 10.9 ng/mL — ABNORMAL LOW (ref 30.0–100.0)

## 2014-11-28 MED ORDER — MORPHINE SULFATE (CONCENTRATE) 10 MG/0.5ML PO SOLN
10.0000 mg | ORAL | Status: DC | PRN
  Administered 2014-11-28: 10 mg via ORAL
  Filled 2014-11-28 (×2): qty 0.5

## 2014-11-28 MED ORDER — FENTANYL 12 MCG/HR TD PT72
12.5000 ug | MEDICATED_PATCH | TRANSDERMAL | Status: DC
  Administered 2014-11-28: 12.5 ug via TRANSDERMAL
  Filled 2014-11-28: qty 1

## 2014-11-28 NOTE — Progress Notes (Signed)
Notified Dr. Cruzita Lederer about the patients IV coming out.  Asked MD if the medication could be changed to Roxinol since she possibly would be a hard IV stick due to no sticks in the right arm due to mastectomy and a positional IV in the left side.  MD stated due to patient possibly beginning Hospice we may can hold out on the IV for now.  New orders given and followed.  Voiced to family/Pt the plan of care.  They verbalized understanding and was agreeable with the POC.

## 2014-11-28 NOTE — Care Management Note (Signed)
Case Management Note  Patient Details  Name: Paula Alvarez MRN: [id number] Date of Birth: [birth year]  Subjective/Objective:                  Pt admitted from home with metastatic cancer. Pt lives alone and wants to return home with hospice. Pts daughter stated that someone will be staying with pt around the clock as different family members will be staying with pt.  Action/Plan: Pt would like [facility]. Referral called and information sent via PL to Hospice. They will admit pt upon discharge from hospital.  Expected Discharge Date:                  Expected Discharge Plan:  [place]  In-House Referral:     Discharge planning Services  CM Consult  Post Acute Care Choice:  Hospice Choice offered to:  Patient  DME Arranged:    DME Agency:     HH Arranged:    Center Agency:     Status of Service:  Completed, signed off  Medicare Important Message Given:    Date Medicare IM Given:    Medicare IM give by:    Date Additional Medicare IM Given:    Additional Medicare Important Message give by:     If discussed at [place] of Stay Meetings, dates discussed:    Additional Comments:  Joylene Draft, RN 11/28/2014, 1:13 PM

## 2014-11-28 NOTE — Plan of Care (Signed)
Problem: Phase II Progression Outcomes Goal: Discharge plan established Outcome: Progressing Working on pain management.  Plan for Hospice being set up.  Family updated.

## 2014-11-28 NOTE — Care Management Important Message (Signed)
Important Message  Patient Details  Name: Paula Alvarez MRN: [id number] Date of Birth: January 02, 1946   Medicare Important Message Given:  Yes-second notification given    Joylene Draft, RN 11/28/2014, 1:15 PM

## 2014-11-28 NOTE — Progress Notes (Signed)
PROGRESS NOTE  Paula Alvarez HBZ:[id number] DOB: [birth year] DOA: 11/26/2014 PCP: Chevis Pretty, FNP  HPI: 69 yo female with htn, hyperlipidemia, hypothyroidism, hx of L breast cancer apparently c/o weakness worse since Monday. Pt has had polyuria. Pt also notes weight loss, and also slight cough and dyspnea. Cough is nonproductive, and pt denies fever, chills, cp, palp , orthopnea, pnd, wt gain, lower ext edema. Pt was brought to ED and noted to have hypercalcemia, and ARF, and hyperkalemia.   Subjective / 24 H Interval events - feeling better, does not want to talk with me about her diagnosis/prognosis - wants everybody to leave her alone, stop this "fuss" and let her "go home"  Assessment/Plan: Principal Problem:   Metastatic disease Active Problems:   Hypertension   Hypothyroidism   Hypercalcemia   Hyperkalemia   ARF (acute renal failure)   Abnormal liver function   Hyponatremia   Hypercalcemia - with significant elevation of her Ca level to > 15, suspicious for malignancy.  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 Dyspnea / cough- morphine prn AKI- likely multifactorial, also with poor po intake, improved Acute encephalopathy Hypothyroidism HTN Elevated liver enzymes- in the setting of metastatic disease Hyponatremia - resolved with hydration Hyperkalemia - in the setting of AKI, improved after Lasix and hydration   Diet:   regular Fluids: NS DVT Prophylaxis:  Lovenox  Code Status: Full Code Family Communication: d/w daughter bedside  Disposition Plan: home with hospice in am  Consultants:  Oncology   Procedures:  None    Antibiotics  Anti-infectives    None       Studies  Ct Abdomen Pelvis Wo Contrast  11/27/2014   CLINICAL DATA:  Shortness of breath and altered mental status with elevated liver enzymes. History of left breast cancer. No IV contrast due to elevated creatinine.  EXAM: CT CHEST, ABDOMEN AND PELVIS WITHOUT CONTRAST  TECHNIQUE: Multidetector CT imaging of the chest, abdomen and pelvis was performed following the standard protocol without IV contrast.  COMPARISON:  None.  FINDINGS: CT CHEST FINDINGS  Normal heart size. Normal caliber thoracic aorta. Aortic calcifications. Scattered lymph nodes demonstrated in the mediastinum are not abnormally enlarged. Borderline prominence of lymph nodes in the right axilla, measuring up to 7 mm short axis dimension. These are likely to be reactive. Postoperative changes with left mastectomy and surgical clips in the left axilla. Esophagus is decompressed.  Evaluation of lungs is limited due to severe respiratory motion artifact. There are moderate size bilateral pleural effusions with atelectasis in both lung bases. Circumscribed nodular opacity in the right middle lung measuring 4 mm diameter. Six-month follow-up study suggested. No pneumothorax.  CT ABDOMEN AND PELVIS FINDINGS  The liver is diffusely enlarged with nodular contour and multiple vague low-attenuation changes throughout the liver. This is likely due to metastatic disease. If the patient cannot have IV contrast material, referral for elective MRI may be useful. The unenhanced appearance of the gallbladder, pancreas, spleen, inferior vena cava are unremarkable. 2.4 cm lesion in the upper pole left kidney with low-attenuation suggesting  a cyst. No hydronephrosis in either kidney. Calcification of abdominal aorta without aneurysm. No  adrenal gland nodules. Mild prominence of periaortic retroperitoneal lymph nodes at 7 mm. These are indeterminate but early metastasis cannot be excluded. Stomach and small bowel are decompressed. Colon is not abnormally distended. No free air or free fluid in the abdomen.  Pelvis: Diverticulosis of the sigmoid colon. No evidence of diverticulitis. Bladder wall is not thickened. Due to should ovaries are not enlarged. Appendix is normal. No free or loculated pelvic fluid collections.  Bones: Diffuse heterogeneous sclerotic and lytic changes throughout the thoracic, lumbar, and sacral spine as well as in the sternum and pelvis and within multiple bilateral ribs consistent with diffuse bone metastasis.  IMPRESSION: Moderate bilateral pleural effusions with bilateral basilar atelectasis. Borderline lymph nodes in the mediastinum and right axilla are indeterminate. 4 mm nodule in the right middle lung.  Diffuse metastases throughout the liver.  Diffuse bone metastases.   Electronically Signed   By: Lucienne Capers M.D.   On: 11/27/2014 00:59   Dg Chest 2 View  11/26/2014   CLINICAL DATA:  Shortness of breath and cough  EXAM: CHEST - 2 VIEW  COMPARISON:  03/10/2010  FINDINGS: Cardiac shadow is stable. Mild vascular congestion with interstitial edema is seen. Bilateral small effusions are seen with likely underlying atelectasis. No bony abnormality is noted. Postsurgical changes in the left axilla are seen.  IMPRESSION: Changes consistent with mild CHF.   Electronically Signed   By: Inez Catalina M.D.   On: 11/26/2014 21:37   Ct Head Wo Contrast  11/26/2014   CLINICAL DATA:  Initial evaluation for slurred speech, shortness of breath.  EXAM: CT HEAD WITHOUT CONTRAST  TECHNIQUE: Contiguous axial images were obtained from the base of the skull through the vertex without intravenous contrast.  COMPARISON:  None.  FINDINGS: There is no acute intracranial hemorrhage or infarct. No mass lesion or midline shift. Gray-white  matter differentiation is well maintained. Ventricles are normal in size without evidence of hydrocephalus. CSF containing spaces are within normal limits. No extra-axial fluid collection. Mild atrophy with chronic microvascular ischemic disease. Vascular calcifications present within the carotid siphons.  The calvarium is intact.  Orbital soft tissues are within normal limits.  The paranasal sinuses and mastoid air cells are well pneumatized and free of fluid.  Scalp soft tissues are unremarkable.  IMPRESSION: 1. No acute intracranial process. 2. Mild atrophy with chronic microvascular ischemic disease. 3. Intracranial atherosclerosis.   Electronically Signed   By: Jeannine Boga M.D.   On: 11/26/2014 22:37   Ct Chest Wo Contrast  11/27/2014   CLINICAL DATA:  Shortness of breath and altered mental status with elevated liver enzymes. History of left breast cancer. No IV contrast due to elevated creatinine.  EXAM: CT CHEST, ABDOMEN AND PELVIS WITHOUT CONTRAST  TECHNIQUE: Multidetector CT imaging of the chest, abdomen and pelvis was performed following the standard protocol without IV contrast.  COMPARISON:  None.  FINDINGS: CT CHEST FINDINGS  Normal heart size. Normal caliber thoracic aorta. Aortic calcifications. Scattered lymph nodes demonstrated in the mediastinum are not abnormally enlarged. Borderline prominence of lymph nodes in the right axilla, measuring up to 7 mm short axis dimension. These are likely to be reactive. Postoperative changes with left mastectomy and surgical clips in the left axilla. Esophagus is decompressed.  Evaluation of lungs is limited due to severe respiratory motion artifact. There are moderate size bilateral pleural effusions with atelectasis in both lung bases. Circumscribed nodular  opacity in the right middle lung measuring 4 mm diameter. Six-month follow-up study suggested. No pneumothorax.  CT ABDOMEN AND PELVIS FINDINGS  The liver is diffusely enlarged with nodular  contour and multiple vague low-attenuation changes throughout the liver. This is likely due to metastatic disease. If the patient cannot have IV contrast material, referral for elective MRI may be useful. The unenhanced appearance of the gallbladder, pancreas, spleen, inferior vena cava are unremarkable. 2.4 cm lesion in the upper pole left kidney with low-attenuation suggesting a cyst. No hydronephrosis in either kidney. Calcification of abdominal aorta without aneurysm. No adrenal gland nodules. Mild prominence of periaortic retroperitoneal lymph nodes at 7 mm. These are indeterminate but early metastasis cannot be excluded. Stomach and small bowel are decompressed. Colon is not abnormally distended. No free air or free fluid in the abdomen.  Pelvis: Diverticulosis of the sigmoid colon. No evidence of diverticulitis. Bladder wall is not thickened. Due to should ovaries are not enlarged. Appendix is normal. No free or loculated pelvic fluid collections.  Bones: Diffuse heterogeneous sclerotic and lytic changes throughout the thoracic, lumbar, and sacral spine as well as in the sternum and pelvis and within multiple bilateral ribs consistent with diffuse bone metastasis.  IMPRESSION: Moderate bilateral pleural effusions with bilateral basilar atelectasis. Borderline lymph nodes in the mediastinum and right axilla are indeterminate. 4 mm nodule in the right middle lung.  Diffuse metastases throughout the liver.  Diffuse bone metastases.   Electronically Signed   By: Lucienne Capers M.D.   On: 11/27/2014 00:59    Objective  Filed Vitals:   11/27/14 1600 11/27/14 2130 11/28/14 0459 11/28/14 1300  BP:  134/69 128/63 117/81  Pulse: 105 110 94 79  Temp:  98.2 F (36.8 C) 98.5 F (36.9 C) 97.8 F (36.6 C)  TempSrc:  Oral Oral Oral  Resp: 28 22 22 22   Height:      Weight:   54.6 kg (120 lb 5.9 oz)   SpO2:  96% 95% 91%    Intake/Output Summary (Last 24 hours) at 11/28/14 1520 Last data filed at 11/28/14  0851  Gross per 24 hour  Intake    240 ml  Output      0 ml  Net    240 ml   Filed Weights   11/26/14 2345 11/27/14 0500 11/28/14 0459  Weight: 55.1 kg (121 lb 7.6 oz) 55.9 kg (123 lb 3.8 oz) 54.6 kg (120 lb 5.9 oz)    Exam:  General:  NAD  HEENT: no scleral icterus, PERRL  Cardiovascular: RRR without MRG, 2+ peripheral pulses, no edema  Respiratory: CTA biL, good air movement, no wheezing, no crackles, no rales  Abdomen: soft, non tender, BS +, no guarding   Data Reviewed: Basic Metabolic Panel:  Recent Labs Lab 11/26/14 2030 11/27/14 0438 11/28/14 0803  NA 134* 140 146*  K 5.5* 4.3 3.2*  CL 99* 104 112*  CO2 17* 21* 21*  GLUCOSE 103* 132* 89  BUN 63* 51* 29*  CREATININE 2.20* 1.49* 0.83  CALCIUM >15.0* 13.3* 11.3*  MG 2.6*  --   --   PHOS 6.7*  --   --    Liver Function Tests:  Recent Labs Lab 11/26/14 2030 11/27/14 0438 11/28/14 0803  AST 212* 194* 174*  ALT 30 28 28   ALKPHOS 445* 450* 424*  BILITOT 1.0 0.8 0.9  PROT 8.9* 8.4* 7.4  ALBUMIN 2.8* 2.7* 2.3*   CBC:  Recent Labs Lab 11/26/14 2030 11/27/14 0438 11/28/14 1448  WBC 7.7 7.3 5.4  NEUTROABS 5.7  --   --   HGB 12.4 11.9* 11.1*  HCT 35.8* 35.5* 33.3*  MCV 94.2 95.9 96.5  PLT 216 235 179   Cardiac Enzymes:  Recent Labs Lab 11/26/14 2030  CKTOTAL 107  CKMB 1.4  TROPONINI <0.03   BNP (last 3 results)  Recent Labs  11/26/14 2030  BNP 44.0    Recent Results (from the past 240 hour(s))  Urine culture     Status: None   Collection Time: 11/24/14 11:01 AM  Result Value Ref Range Status   Specimen Description URINE, CLEAN CATCH  Final   Special Requests Normal  Final   Culture   Final    MULTIPLE SPECIES PRESENT, SUGGEST RECOLLECTION IF CLINICALLY INDICATED Performed at [facility]    Report Status 11/26/2014 FINAL  Final  MRSA PCR Screening     Status: None   Collection Time: 11/26/14 11:34 PM  Result Value Ref Range Status   MRSA by PCR NEGATIVE NEGATIVE  Final    Comment:        The GeneXpert MRSA Assay (FDA approved for NASAL specimens only), is one component of a comprehensive MRSA colonization surveillance program. It is not intended to diagnose MRSA infection nor to guide or monitor treatment for MRSA infections.      Scheduled Meds: . enoxaparin (LOVENOX) injection  30 mg Subcutaneous Q24H  . levothyroxine  25 mcg Oral QAC breakfast  . metoprolol tartrate  25 mg Oral BID  . sodium chloride  3 mL Intravenous Q12H   Continuous Infusions: . sodium chloride 50 mL/hr at 11/27/14 1951   Marzetta Board, MD [facility] Pager [phone]. If 7 PM - 7 AM, please contact night-coverage at [email or web address], password [facility] 11/28/2014, 3:20 PM  LOS: 2 days

## 2014-11-28 NOTE — Progress Notes (Addendum)
Subjective: Patient in bed.  She is comfortable.  She denies any pain and reports that current pain medications are manbaging her discomfort.  I see 2 instances of IV morphine.  IV acess was lost and she was switched to PO Roxanol.  She has only used 1 dose of Roxanol.  I spoke with Case Manager.  Goal is to have Hospice consult the patient and family at home.   Objective: Vital signs in last 24 hours: Temp:  [97.8 F (36.6 C)-98.5 F (36.9 C)] 97.8 F (36.6 C) (07/15 1300) Pulse Rate:  [79-110] 79 (07/15 1300) Resp:  [22-28] 22 (07/15 1300) BP: (117-134)/(63-81) 117/81 mmHg (07/15 1300) SpO2:  [91 %-96 %] 91 % (07/15 1300) Weight:  [120 lb 5.9 oz (54.6 kg)] 120 lb 5.9 oz (54.6 kg) (07/15 0459)  Intake/Output from previous day: 07/14 0800 - 07/15 0759 In: 120 [P.O.:120] Out: -  Intake/Output this shift: Total I/O In: 120 [P.O.:120] Out: -   General appearance: in bed, comfortable  Lab Results:   Recent Labs  11/27/14 0438 11/28/14 0803  WBC 7.3 5.4  HGB 11.9* 11.1*  HCT 35.5* 33.3*  PLT 235 179   BMET  Recent Labs  11/27/14 0438 11/28/14 0803  NA 140 146*  K 4.3 3.2*  CL 104 112*  CO2 21* 21*  GLUCOSE 132* 89  BUN 51* 29*  CREATININE 1.49* 0.83  CALCIUM 13.3* 11.3*    Studies/Results: Ct Abdomen Pelvis Wo Contrast  11/27/2014   CLINICAL DATA:  Shortness of breath and altered mental status with elevated liver enzymes. History of left breast cancer. No IV contrast due to elevated creatinine.  EXAM: CT CHEST, ABDOMEN AND PELVIS WITHOUT CONTRAST  TECHNIQUE: Multidetector CT imaging of the chest, abdomen and pelvis was performed following the standard protocol without IV contrast.  COMPARISON:  None.  FINDINGS: CT CHEST FINDINGS  Normal heart size. Normal caliber thoracic aorta. Aortic calcifications. Scattered lymph nodes demonstrated in the mediastinum are not abnormally enlarged. Borderline prominence of lymph nodes in the right axilla, measuring up to 7 mm short  axis dimension. These are likely to be reactive. Postoperative changes with left mastectomy and surgical clips in the left axilla. Esophagus is decompressed.  Evaluation of lungs is limited due to severe respiratory motion artifact. There are moderate size bilateral pleural effusions with atelectasis in both lung bases. Circumscribed nodular opacity in the right middle lung measuring 4 mm diameter. Six-month follow-up study suggested. No pneumothorax.  CT ABDOMEN AND PELVIS FINDINGS  The liver is diffusely enlarged with nodular contour and multiple vague low-attenuation changes throughout the liver. This is likely due to metastatic disease. If the patient cannot have IV contrast material, referral for elective MRI may be useful. The unenhanced appearance of the gallbladder, pancreas, spleen, inferior vena cava are unremarkable. 2.4 cm lesion in the upper pole left kidney with low-attenuation suggesting a cyst. No hydronephrosis in either kidney. Calcification of abdominal aorta without aneurysm. No adrenal gland nodules. Mild prominence of periaortic retroperitoneal lymph nodes at 7 mm. These are indeterminate but early metastasis cannot be excluded. Stomach and small bowel are decompressed. Colon is not abnormally distended. No free air or free fluid in the abdomen.  Pelvis: Diverticulosis of the sigmoid colon. No evidence of diverticulitis. Bladder wall is not thickened. Due to should ovaries are not enlarged. Appendix is normal. No free or loculated pelvic fluid collections.  Bones: Diffuse heterogeneous sclerotic and lytic changes throughout the thoracic, lumbar, and sacral spine as well as  in the sternum and pelvis and within multiple bilateral ribs consistent with diffuse bone metastasis.  IMPRESSION: Moderate bilateral pleural effusions with bilateral basilar atelectasis. Borderline lymph nodes in the mediastinum and right axilla are indeterminate. 4 mm nodule in the right middle lung.  Diffuse metastases  throughout the liver.  Diffuse bone metastases.   Electronically Signed   By: Lucienne Capers M.D.   On: 11/27/2014 00:59   Dg Chest 2 View  11/26/2014   CLINICAL DATA:  Shortness of breath and cough  EXAM: CHEST - 2 VIEW  COMPARISON:  03/10/2010  FINDINGS: Cardiac shadow is stable. Mild vascular congestion with interstitial edema is seen. Bilateral small effusions are seen with likely underlying atelectasis. No bony abnormality is noted. Postsurgical changes in the left axilla are seen.  IMPRESSION: Changes consistent with mild CHF.   Electronically Signed   By: Inez Catalina M.D.   On: 11/26/2014 21:37   Ct Head Wo Contrast  11/26/2014   CLINICAL DATA:  Initial evaluation for slurred speech, shortness of breath.  EXAM: CT HEAD WITHOUT CONTRAST  TECHNIQUE: Contiguous axial images were obtained from the base of the skull through the vertex without intravenous contrast.  COMPARISON:  None.  FINDINGS: There is no acute intracranial hemorrhage or infarct. No mass lesion or midline shift. Gray-white matter differentiation is well maintained. Ventricles are normal in size without evidence of hydrocephalus. CSF containing spaces are within normal limits. No extra-axial fluid collection. Mild atrophy with chronic microvascular ischemic disease. Vascular calcifications present within the carotid siphons.  The calvarium is intact.  Orbital soft tissues are within normal limits.  The paranasal sinuses and mastoid air cells are well pneumatized and free of fluid.  Scalp soft tissues are unremarkable.  IMPRESSION: 1. No acute intracranial process. 2. Mild atrophy with chronic microvascular ischemic disease. 3. Intracranial atherosclerosis.   Electronically Signed   By: Jeannine Boga M.D.   On: 11/26/2014 22:37   Ct Chest Wo Contrast  11/27/2014   CLINICAL DATA:  Shortness of breath and altered mental status with elevated liver enzymes. History of left breast cancer. No IV contrast due to elevated creatinine.   EXAM: CT CHEST, ABDOMEN AND PELVIS WITHOUT CONTRAST  TECHNIQUE: Multidetector CT imaging of the chest, abdomen and pelvis was performed following the standard protocol without IV contrast.  COMPARISON:  None.  FINDINGS: CT CHEST FINDINGS  Normal heart size. Normal caliber thoracic aorta. Aortic calcifications. Scattered lymph nodes demonstrated in the mediastinum are not abnormally enlarged. Borderline prominence of lymph nodes in the right axilla, measuring up to 7 mm short axis dimension. These are likely to be reactive. Postoperative changes with left mastectomy and surgical clips in the left axilla. Esophagus is decompressed.  Evaluation of lungs is limited due to severe respiratory motion artifact. There are moderate size bilateral pleural effusions with atelectasis in both lung bases. Circumscribed nodular opacity in the right middle lung measuring 4 mm diameter. Six-month follow-up study suggested. No pneumothorax.  CT ABDOMEN AND PELVIS FINDINGS  The liver is diffusely enlarged with nodular contour and multiple vague low-attenuation changes throughout the liver. This is likely due to metastatic disease. If the patient cannot have IV contrast material, referral for elective MRI may be useful. The unenhanced appearance of the gallbladder, pancreas, spleen, inferior vena cava are unremarkable. 2.4 cm lesion in the upper pole left kidney with low-attenuation suggesting a cyst. No hydronephrosis in either kidney. Calcification of abdominal aorta without aneurysm. No adrenal gland nodules. Mild prominence of periaortic  retroperitoneal lymph nodes at 7 mm. These are indeterminate but early metastasis cannot be excluded. Stomach and small bowel are decompressed. Colon is not abnormally distended. No free air or free fluid in the abdomen.  Pelvis: Diverticulosis of the sigmoid colon. No evidence of diverticulitis. Bladder wall is not thickened. Due to should ovaries are not enlarged. Appendix is normal. No free or  loculated pelvic fluid collections.  Bones: Diffuse heterogeneous sclerotic and lytic changes throughout the thoracic, lumbar, and sacral spine as well as in the sternum and pelvis and within multiple bilateral ribs consistent with diffuse bone metastasis.  IMPRESSION: Moderate bilateral pleural effusions with bilateral basilar atelectasis. Borderline lymph nodes in the mediastinum and right axilla are indeterminate. 4 mm nodule in the right middle lung.  Diffuse metastases throughout the liver.  Diffuse bone metastases.   Electronically Signed   By: Lucienne Capers M.D.   On: 11/27/2014 00:59    Medications: I have reviewed the patient's current medications.  Assessment/Plan: 1. Metastatic malignancy, likley breast cancer given her history. 2. Hypercalcemia due to malignancy.  S/P hydration and Pamidronate 3. Cancer related pain.  Currently on Roxanol.  Will add 12.5 mcg Fentanyl patch. 4. Hospice consult placed.  Plan is for patient to go home with Hospice according to Case Management.  Patient is a candidate for [facility] if so desired.  Patient and plan discussed with Dr. Ancil Linsey and she is in agreement with the aforementioned.     LOS: 2 days    Shona Pardo 11/28/2014

## 2014-11-29 MED ORDER — MORPHINE SULFATE (CONCENTRATE) 10 MG/0.5ML PO SOLN: 10.0000 mg | ORAL | Status: DC | PRN

## 2014-11-29 MED ORDER — ENOXAPARIN SODIUM 40 MG/0.4ML ~~LOC~~ SOLN: 40.0000 mg | SUBCUTANEOUS | Status: DC

## 2014-11-29 MED ORDER — FENTANYL 12 MCG/HR TD PT72: 12.5000 ug | MEDICATED_PATCH | TRANSDERMAL | Status: AC

## 2014-11-29 NOTE — Progress Notes (Signed)
Orders received for discharge home with hospice care. Paula Alvarez with hospice notified of patient's discharge. Prescriptions faxed to [facility]. Discharge instructions including medications and follow up appointments explained to patient and pt's daughter; understanding verbalized. Pt's daughter stated that hospice had already delivered the equipment to the home and the pharmacy had already delivered the medications as well. Pt taken out via [place] with belongings.

## 2014-11-29 NOTE — Discharge Summary (Addendum)
Physician Discharge Summary  Paula Alvarez OIZ:[id number] DOB: April 19, 1946 DOA: 11/26/2014  PCP: Chevis Pretty, FNP  Admit date: 11/26/2014 Discharge date: 11/29/2014  Time spent: > 35 minutes  Recommendations for Outpatient Follow-up:  1. Follow up with hospice services   Discharge Diagnoses:  Principal Problem:   Metastatic disease Active Problems:   Hypertension   Hypothyroidism   Hyperkalemia   ARF (acute renal failure)   Abnormal liver function   Hyponatremia   Hypercalcemia of malignancy  Discharge Condition: guarded, with hospice  Diet recommendation: regular  Filed Weights   11/27/14 0500 11/28/14 0459 11/29/14 0500  Weight: 55.9 kg (123 lb 3.8 oz) 54.6 kg (120 lb 5.9 oz) 54.8 kg (120 lb 13 oz)   History of present illness:  Per Dr. Maudie Mercury, 69 yo female with htn, hyperlipidemia, hypothyroidism, hx of L breast cancer apparently c/o weakness worse since Monday. Pt has had polyuria. Pt also notes weight loss, and also slight cough and dyspnea. Cough is nonproductive, and pt denies fever, chills, cp, palp , orthopnea, pnd, wt gain, lower ext edema. Pt was brought to ED and noted to have hypercalcemia, and ARF, and hyperkalemia.    Hospital course: Hypercalcemia - with significant elevation of her Ca level to > 15, suspicious for malignancy, imaging of chest / abdomen and pelvis indeed showed diffuse metastatic disease throughout the liver and diffuse bone metastasis, s/p pamidronate on admission as well as IV hydration. Her calcium levels have been improving.  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 Patient's family insisted that patient is transferred to [facility], and transfer process was initiated and a bed was obtained. Prior to transfer to [place], patient and her family asked to discuss with our oncologists first. Oncology was consulted and have discussed extensively with patient and  patient's family. Given advanced disease and after discussing with Dr. Whitney Muse, patient and her family expressed no interest in pursuing biopsy or aggressive treatment and asked that [facility] transfer be cancelled. She expressed interest in being "left alone" and that family members "stop that fuss" and asking to go home. Hospice was set up per their wishes and she was discharged home with hospice services.  Dyspnea / cough- morphine prn AKI- likely multifactorial, also with poor po intake, improved Acute encephalopathy - due to hypercalcemia, resolved Hypothyroidism - on synthroid HTN Elevated liver enzymes- in the setting of metastatic disease Hyponatremia - resolved with hydration Hyperkalemia - in the setting of AKI, improved after Lasix and hydration  Procedures:  None    Consultations:  Medical oncology  Hospice  Discharge Exam: Filed Vitals:   11/28/14 2111 11/29/14 0500 11/29/14 0551 11/29/14 1448  BP: 119/68  125/59 120/70  Pulse: 97  90 86  Temp:   97.9 F (36.6 C) 98.1 F (36.7 C)  TempSrc:   Oral   Resp: 20  18 20   Height:      Weight:  54.8 kg (120 lb 13 oz)    SpO2:   92% 98%    General: NAD Cardiovascular: RRR Respiratory: CTA biL  Discharge Instructions     Medication List    STOP taking these medications        predniSONE 20 MG tablet  Commonly known as:  DELTASONE      TAKE these medications        acetaminophen 500 MG tablet  Commonly known as:  TYLENOL  Take 1,000 mg by mouth every 6 (  six) hours as needed for moderate pain.     amLODipine-benazepril 5-40 MG per capsule  Commonly known as:  LOTREL  Take 1 capsule by mouth daily.     fentaNYL 12 MCG/HR  Commonly known as:  DURAGESIC - dosed mcg/hr  Place 1 patch (12.5 mcg total) onto the skin every 3 (three) days.     ICY HOT EX  Apply 1 application topically daily as needed (back pain).     levothyroxine 25 MCG tablet  Commonly known as:  SYNTHROID, LEVOTHROID  Take 1 tablet (25 mcg  total) by mouth daily.     metoprolol tartrate 25 MG tablet  Commonly known as:  LOPRESSOR  Take 1 tablet (25 mg total) by mouth 2 (two) times daily.     morphine CONCENTRATE 10 MG/0.5ML Soln concentrated solution  Take 0.5 mLs (10 mg total) by mouth every 3 (three) hours as needed for severe pain.     naproxen 375 MG tablet  Commonly known as:  NAPROSYN  Take 1 tablet (375 mg total) by mouth 2 (two) times daily.           Follow-up Information    Follow up with [facility].   Contact information:   [address] [place] [address] [phone]       The results of significant diagnostics from this hospitalization (including imaging, microbiology, ancillary and laboratory) are listed below for reference.    Significant Diagnostic Studies: Ct Abdomen Pelvis Wo Contrast  11/27/2014   CLINICAL DATA:  Shortness of breath and altered mental status with elevated liver enzymes. History of left breast cancer. No IV contrast due to elevated creatinine.  EXAM: CT CHEST, ABDOMEN AND PELVIS WITHOUT CONTRAST  TECHNIQUE: Multidetector CT imaging of the chest, abdomen and pelvis was performed following the standard protocol without IV contrast.  COMPARISON:  None.  FINDINGS: CT CHEST FINDINGS  Normal heart size. Normal caliber thoracic aorta. Aortic calcifications. Scattered lymph nodes demonstrated in the mediastinum are not abnormally enlarged. Borderline prominence of lymph nodes in the right axilla, measuring up to 7 mm short axis dimension. These are likely to be reactive. Postoperative changes with left mastectomy and surgical clips in the left axilla. Esophagus is decompressed.  Evaluation of lungs is limited due to severe respiratory motion artifact. There are moderate size bilateral pleural effusions with atelectasis in both lung bases. Circumscribed nodular opacity in the right middle lung measuring 4 mm diameter. Six-month follow-up study suggested. No pneumothorax.  CT  ABDOMEN AND PELVIS FINDINGS  The liver is diffusely enlarged with nodular contour and multiple vague low-attenuation changes throughout the liver. This is likely due to metastatic disease. If the patient cannot have IV contrast material, referral for elective MRI may be useful. The unenhanced appearance of the gallbladder, pancreas, spleen, inferior vena cava are unremarkable. 2.4 cm lesion in the upper pole left kidney with low-attenuation suggesting a cyst. No hydronephrosis in either kidney. Calcification of abdominal aorta without aneurysm. No adrenal gland nodules. Mild prominence of periaortic retroperitoneal lymph nodes at 7 mm. These are indeterminate but early metastasis cannot be excluded. Stomach and small bowel are decompressed. Colon is not abnormally distended. No free air or free fluid in the abdomen.  Pelvis: Diverticulosis of the sigmoid colon. No evidence of diverticulitis. Bladder wall is not thickened. Due to should ovaries are not enlarged. Appendix is normal. No free or loculated pelvic fluid collections.  Bones: Diffuse heterogeneous sclerotic and lytic changes throughout the thoracic, lumbar, and  sacral spine as well as in the sternum and pelvis and within multiple bilateral ribs consistent with diffuse bone metastasis.  IMPRESSION: Moderate bilateral pleural effusions with bilateral basilar atelectasis. Borderline lymph nodes in the mediastinum and right axilla are indeterminate. 4 mm nodule in the right middle lung.  Diffuse metastases throughout the liver.  Diffuse bone metastases.   Electronically Signed   By: Lucienne Capers M.D.   On: 11/27/2014 00:59   Dg Chest 2 View  11/26/2014   CLINICAL DATA:  Shortness of breath and cough  EXAM: CHEST - 2 VIEW  COMPARISON:  03/10/2010  FINDINGS: Cardiac shadow is stable. Mild vascular congestion with interstitial edema is seen. Bilateral small effusions are seen with likely underlying atelectasis. No bony abnormality is noted. Postsurgical  changes in the left axilla are seen.  IMPRESSION: Changes consistent with mild CHF.   Electronically Signed   By: Inez Catalina M.D.   On: 11/26/2014 21:37   Dg Lumbar Spine 2-3 Views  11/04/2014   CLINICAL DATA:  Low back pain, no injury  EXAM: LUMBAR SPINE - 2-3 VIEW  COMPARISON:  None.  FINDINGS: The bones are diffusely osteopenic. The lumbar vertebrae are in normal alignment. However there does appear to be a compression deformity of T12 vertebral body approaching 50%. No definite retropulsion is seen. Also, there is some irregularity of the anterior superior aspect of L2 and a mild compression deformity in that region is a definite consideration. If more sensitive assessment is warranted clinically then MRI of the lumbar spine would be recommended. Intervertebral disc spaces are relatively well preserved. The SI joints appear corticated.  IMPRESSION: 1. Partial compression deformity of L57 of uncertain age. No definite retropulsion. 2. Question mild compression deformity of the anterior superior aspect of L2 also of uncertain age. Consider MRI if further assessment is warranted.   Electronically Signed   By: Ivar Drape M.D.   On: 11/04/2014 11:40   Ct Head Wo Contrast  11/26/2014   CLINICAL DATA:  Initial evaluation for slurred speech, shortness of breath.  EXAM: CT HEAD WITHOUT CONTRAST  TECHNIQUE: Contiguous axial images were obtained from the base of the skull through the vertex without intravenous contrast.  COMPARISON:  None.  FINDINGS: There is no acute intracranial hemorrhage or infarct. No mass lesion or midline shift. Gray-white matter differentiation is well maintained. Ventricles are normal in size without evidence of hydrocephalus. CSF containing spaces are within normal limits. No extra-axial fluid collection. Mild atrophy with chronic microvascular ischemic disease. Vascular calcifications present within the carotid siphons.  The calvarium is intact.  Orbital soft tissues are within normal  limits.  The paranasal sinuses and mastoid air cells are well pneumatized and free of fluid.  Scalp soft tissues are unremarkable.  IMPRESSION: 1. No acute intracranial process. 2. Mild atrophy with chronic microvascular ischemic disease. 3. Intracranial atherosclerosis.   Electronically Signed   By: Jeannine Boga M.D.   On: 11/26/2014 22:37   Ct Chest Wo Contrast  11/27/2014   CLINICAL DATA:  Shortness of breath and altered mental status with elevated liver enzymes. History of left breast cancer. No IV contrast due to elevated creatinine.  EXAM: CT CHEST, ABDOMEN AND PELVIS WITHOUT CONTRAST  TECHNIQUE: Multidetector CT imaging of the chest, abdomen and pelvis was performed following the standard protocol without IV contrast.  COMPARISON:  None.  FINDINGS: CT CHEST FINDINGS  Normal heart size. Normal caliber thoracic aorta. Aortic calcifications. Scattered lymph nodes demonstrated in the mediastinum are not  abnormally enlarged. Borderline prominence of lymph nodes in the right axilla, measuring up to 7 mm short axis dimension. These are likely to be reactive. Postoperative changes with left mastectomy and surgical clips in the left axilla. Esophagus is decompressed.  Evaluation of lungs is limited due to severe respiratory motion artifact. There are moderate size bilateral pleural effusions with atelectasis in both lung bases. Circumscribed nodular opacity in the right middle lung measuring 4 mm diameter. Six-month follow-up study suggested. No pneumothorax.  CT ABDOMEN AND PELVIS FINDINGS  The liver is diffusely enlarged with nodular contour and multiple vague low-attenuation changes throughout the liver. This is likely due to metastatic disease. If the patient cannot have IV contrast material, referral for elective MRI may be useful. The unenhanced appearance of the gallbladder, pancreas, spleen, inferior vena cava are unremarkable. 2.4 cm lesion in the upper pole left kidney with low-attenuation  suggesting a cyst. No hydronephrosis in either kidney. Calcification of abdominal aorta without aneurysm. No adrenal gland nodules. Mild prominence of periaortic retroperitoneal lymph nodes at 7 mm. These are indeterminate but early metastasis cannot be excluded. Stomach and small bowel are decompressed. Colon is not abnormally distended. No free air or free fluid in the abdomen.  Pelvis: Diverticulosis of the sigmoid colon. No evidence of diverticulitis. Bladder wall is not thickened. Due to should ovaries are not enlarged. Appendix is normal. No free or loculated pelvic fluid collections.  Bones: Diffuse heterogeneous sclerotic and lytic changes throughout the thoracic, lumbar, and sacral spine as well as in the sternum and pelvis and within multiple bilateral ribs consistent with diffuse bone metastasis.  IMPRESSION: Moderate bilateral pleural effusions with bilateral basilar atelectasis. Borderline lymph nodes in the mediastinum and right axilla are indeterminate. 4 mm nodule in the right middle lung.  Diffuse metastases throughout the liver.  Diffuse bone metastases.   Electronically Signed   By: Lucienne Capers M.D.   On: 11/27/2014 00:59    Microbiology: Recent Results (from the past 240 hour(s))  Urine culture     Status: None   Collection Time: 11/24/14 11:01 AM  Result Value Ref Range Status   Specimen Description URINE, CLEAN CATCH  Final   Special Requests Normal  Final   Culture   Final    MULTIPLE SPECIES PRESENT, SUGGEST RECOLLECTION IF CLINICALLY INDICATED Performed at [facility]    Report Status 11/26/2014 FINAL  Final  MRSA PCR Screening     Status: None   Collection Time: 11/26/14 11:34 PM  Result Value Ref Range Status   MRSA by PCR NEGATIVE NEGATIVE Final    Comment:        The GeneXpert MRSA Assay (FDA approved for NASAL specimens only), is one component of a comprehensive MRSA colonization surveillance program. It is not intended to diagnose MRSA infection  nor to guide or monitor treatment for MRSA infections.      Labs: Basic Metabolic Panel:  Recent Labs Lab 11/26/14 2030 11/27/14 0438 11/28/14 0803  NA 134* 140 146*  K 5.5* 4.3 3.2*  CL 99* 104 112*  CO2 17* 21* 21*  GLUCOSE 103* 132* 89  BUN 63* 51* 29*  CREATININE 2.20* 1.49* 0.83  CALCIUM >15.0* 13.3* 11.3*  MG 2.6*  --   --   PHOS 6.7*  --   --    Liver Function Tests:  Recent Labs Lab 11/26/14 2030 11/27/14 0438 11/28/14 0803  AST 212* 194* 174*  ALT 30 28 28   ALKPHOS 445* 450* 424*  BILITOT  1.0 0.8 0.9  PROT 8.9* 8.4* 7.4  ALBUMIN 2.8* 2.7* 2.3*   CBC:  Recent Labs Lab 11/26/14 2030 11/27/14 0438 11/28/14 0803  WBC 7.7 7.3 5.4  NEUTROABS 5.7  --   --   HGB 12.4 11.9* 11.1*  HCT 35.8* 35.5* 33.3*  MCV 94.2 95.9 96.5  PLT 216 235 179   Cardiac Enzymes:  Recent Labs Lab 11/26/14 2030  CKTOTAL 107  CKMB 1.4  TROPONINI <0.03   BNP: BNP (last 3 results)  Recent Labs  11/26/14 2030  BNP 44.0     Signed:  GHERGHE, COSTIN  [facility] 11/29/2014, 8:38 PM

## 2014-12-01 ENCOUNTER — Telehealth: Payer: Self-pay | Admitting: Nurse Practitioner

## 2014-12-02 LAB — VITAMIN D 1,25 DIHYDROXY

## 2014-12-02 LAB — PTH-RELATED PEPTIDE: PTH-related peptide: 1.3 pmol/L

## 2014-12-03 NOTE — Telephone Encounter (Signed)
Called and pt was not comfortable in hospital bed, so does not want at this time.

## 2014-12-03 NOTE — Telephone Encounter (Signed)
Patient was admitted to hospital on 7/13.

## 2014-12-05 ENCOUNTER — Ambulatory Visit (HOSPITAL_COMMUNITY): Admission: RE | Admit: 2014-12-05 | Payer: Medicare Other | Source: Ambulatory Visit

## 2014-12-08 ENCOUNTER — Emergency Department (HOSPITAL_COMMUNITY): Payer: Medicare Other

## 2014-12-08 ENCOUNTER — Observation Stay (HOSPITAL_COMMUNITY)
Admission: EM | Admit: 2014-12-08 | Discharge: 2014-12-09 | Disposition: A | Discharge status: Home / Self Care | Payer: Medicare Other | Attending: Internal Medicine | Admitting: Internal Medicine

## 2014-12-08 ENCOUNTER — Encounter (HOSPITAL_COMMUNITY): Payer: Self-pay

## 2014-12-08 DIAGNOSIS — I10 Essential (primary) hypertension: Secondary | ICD-10-CM | POA: Diagnosis not present

## 2014-12-08 DIAGNOSIS — C50912 Malignant neoplasm of unspecified site of left female breast: Secondary | ICD-10-CM

## 2014-12-08 DIAGNOSIS — E038 Other specified hypothyroidism: Secondary | ICD-10-CM | POA: Diagnosis not present

## 2014-12-08 DIAGNOSIS — J90 Pleural effusion, not elsewhere classified: Secondary | ICD-10-CM | POA: Diagnosis not present

## 2014-12-08 DIAGNOSIS — M791 Myalgia: Secondary | ICD-10-CM | POA: Insufficient documentation

## 2014-12-08 DIAGNOSIS — C799 Secondary malignant neoplasm of unspecified site: Secondary | ICD-10-CM | POA: Diagnosis present

## 2014-12-08 DIAGNOSIS — E079 Disorder of thyroid, unspecified: Secondary | ICD-10-CM | POA: Insufficient documentation

## 2014-12-08 DIAGNOSIS — Z79899 Other long term (current) drug therapy: Secondary | ICD-10-CM | POA: Diagnosis not present

## 2014-12-08 DIAGNOSIS — R531 Weakness: Secondary | ICD-10-CM | POA: Diagnosis not present

## 2014-12-08 DIAGNOSIS — Z87891 Personal history of nicotine dependence: Secondary | ICD-10-CM | POA: Insufficient documentation

## 2014-12-08 DIAGNOSIS — E039 Hypothyroidism, unspecified: Secondary | ICD-10-CM | POA: Diagnosis present

## 2014-12-08 DIAGNOSIS — R05 Cough: Secondary | ICD-10-CM | POA: Insufficient documentation

## 2014-12-08 DIAGNOSIS — R0902 Hypoxemia: Secondary | ICD-10-CM

## 2014-12-08 DIAGNOSIS — R0602 Shortness of breath: Secondary | ICD-10-CM | POA: Insufficient documentation

## 2014-12-08 DIAGNOSIS — M1990 Unspecified osteoarthritis, unspecified site: Secondary | ICD-10-CM | POA: Insufficient documentation

## 2014-12-08 DIAGNOSIS — E872 Acidosis, unspecified: Secondary | ICD-10-CM | POA: Diagnosis present

## 2014-12-08 DIAGNOSIS — E785 Hyperlipidemia, unspecified: Secondary | ICD-10-CM | POA: Insufficient documentation

## 2014-12-08 DIAGNOSIS — J811 Chronic pulmonary edema: Secondary | ICD-10-CM | POA: Diagnosis present

## 2014-12-08 DIAGNOSIS — R5383 Other fatigue: Secondary | ICD-10-CM | POA: Diagnosis present

## 2014-12-08 DIAGNOSIS — D696 Thrombocytopenia, unspecified: Secondary | ICD-10-CM | POA: Diagnosis present

## 2014-12-08 LAB — CBC WITH DIFFERENTIAL/PLATELET
BASOS ABS: 0.1 10*3/uL (ref 0.0–0.1)
BASOS PCT: 1 % (ref 0–1)
EOS ABS: 0 10*3/uL (ref 0.0–0.7)
EOS PCT: 0 % (ref 0–5)
HCT: 32.7 % — ABNORMAL LOW (ref 36.0–46.0)
Hemoglobin: 11 g/dL — ABNORMAL LOW (ref 12.0–15.0)
LYMPHS ABS: 1.2 10*3/uL (ref 0.7–4.0)
Lymphocytes Relative: 16 % (ref 12–46)
MCH: 32 pg (ref 26.0–34.0)
MCHC: 33.6 g/dL (ref 30.0–36.0)
MCV: 95.1 fL (ref 78.0–100.0)
MONOS PCT: 6 % (ref 3–12)
Monocytes Absolute: 0.5 10*3/uL (ref 0.1–1.0)
Neutro Abs: 6.1 10*3/uL (ref 1.7–7.7)
Neutrophils Relative %: 77 % (ref 43–77)
Platelets: 118 10*3/uL — ABNORMAL LOW (ref 150–400)
RBC: 3.44 MIL/uL — AB (ref 3.87–5.11)
RDW: 17 % — ABNORMAL HIGH (ref 11.5–15.5)
SMEAR REVIEW: ADEQUATE
WBC: 7.8 10*3/uL (ref 4.0–10.5)

## 2014-12-08 LAB — COMPREHENSIVE METABOLIC PANEL
ALT: 30 U/L (ref 14–54)
AST: 158 U/L — AB (ref 15–41)
Albumin: 2 g/dL — ABNORMAL LOW (ref 3.5–5.0)
Alkaline Phosphatase: 630 U/L — ABNORMAL HIGH (ref 38–126)
Anion gap: 15 (ref 5–15)
BUN: 34 mg/dL — ABNORMAL HIGH (ref 6–20)
CO2: 18 mmol/L — AB (ref 22–32)
Calcium: 9.2 mg/dL (ref 8.9–10.3)
Chloride: 104 mmol/L (ref 101–111)
Creatinine, Ser: 0.87 mg/dL (ref 0.44–1.00)
GFR calc non Af Amer: >60 mL/min (ref >60)
Glucose, Bld: 83 mg/dL (ref 65–99)
POTASSIUM: 4.6 mmol/L (ref 3.5–5.1)
Sodium: 137 mmol/L (ref 135–145)
Total Bilirubin: 0.7 mg/dL (ref 0.3–1.2)
Total Protein: 7.2 g/dL (ref 6.5–8.1)

## 2014-12-08 LAB — TROPONIN I: TROPONIN I: 0.03 ng/mL (ref <0.031)

## 2014-12-08 LAB — BRAIN NATRIURETIC PEPTIDE: B Natriuretic Peptide: 71 pg/mL (ref 0.0–100.0)

## 2014-12-08 MED ORDER — METOPROLOL TARTRATE 25 MG PO TABS
25.0000 mg | ORAL_TABLET | Freq: Two times a day (BID) | ORAL | Status: DC
  Administered 2014-12-09: 25 mg via ORAL
  Filled 2014-12-08 (×2): qty 1

## 2014-12-08 MED ORDER — SODIUM CHLORIDE 0.9 % IV BOLUS (SEPSIS)
1000.0000 mL | Freq: Once | INTRAVENOUS | Status: AC
  Administered 2014-12-08: 1000 mL via INTRAVENOUS

## 2014-12-08 MED ORDER — FENTANYL 12 MCG/HR TD PT72: 12.5000 ug | MEDICATED_PATCH | TRANSDERMAL | Status: DC

## 2014-12-08 MED ORDER — HEPARIN SODIUM (PORCINE) 5000 UNIT/ML IJ SOLN
5000.0000 [IU] | Freq: Three times a day (TID) | INTRAMUSCULAR | Status: DC
  Administered 2014-12-09: 5000 [IU] via SUBCUTANEOUS
  Filled 2014-12-08: qty 1

## 2014-12-08 MED ORDER — AMLODIPINE BESYLATE 5 MG PO TABS
5.0000 mg | ORAL_TABLET | Freq: Every day | ORAL | Status: DC
  Filled 2014-12-08: qty 1

## 2014-12-08 MED ORDER — FUROSEMIDE 10 MG/ML IJ SOLN
20.0000 mg | Freq: Once | INTRAMUSCULAR | Status: AC
  Administered 2014-12-09: 20 mg via INTRAVENOUS
  Filled 2014-12-08: qty 2

## 2014-12-08 MED ORDER — AMLODIPINE BESY-BENAZEPRIL HCL 5-40 MG PO CAPS: 1.0000 | ORAL_CAPSULE | Freq: Every day | ORAL | Status: DC

## 2014-12-08 MED ORDER — LEVOTHYROXINE SODIUM 25 MCG PO TABS
25.0000 ug | ORAL_TABLET | Freq: Every day | ORAL | Status: DC
  Administered 2014-12-09: 25 ug via ORAL
  Filled 2014-12-08: qty 1

## 2014-12-08 MED ORDER — BENAZEPRIL HCL 10 MG PO TABS
40.0000 mg | ORAL_TABLET | Freq: Every day | ORAL | Status: DC
  Filled 2014-12-08: qty 4

## 2014-12-08 NOTE — ED Notes (Signed)
Patient on cardiac monitor. States that she does not make much urine at night.

## 2014-12-08 NOTE — ED Notes (Signed)
Ambulated patient around nurses dest 1x. HR 132 O2 sat 91-93 w/o oxygen. Patient was slow walking and needed some assistance. JGW

## 2014-12-08 NOTE — ED Provider Notes (Signed)
CSN: [id number]     Arrival date & time 12/08/14  1946 History   First MD Initiated Contact with Patient 12/08/14 1957     Chief Complaint  Patient presents with  . Fatigue     (Consider location/radiation/quality/duration/timing/severity/associated sxs/prior Treatment) HPI Comments:  Patient home with generalized weakness for the past 3 days. States she also feels short of breath. Patient states she does not know why she was recently hospitalized. Record review shows that she was hospitalized on July 13 for generalized weakness and hypercalcemia. She was found to have diffuse metastatic disease presumed to be from breast primary. She has remote history of breast cancer in 2011. Patient expressed wishes that she does not wish for biopsy or treatment. She denies any fever she denies any abdominal pain, nausea or vomiting. She has a nonproductive cough that is worse at night. She denies any urinary symptoms. She denies any leg pain or leg swelling. She denies any chest pain, headache neck pain or back pain.  The history is provided by the patient.    Past Medical History  Diagnosis Date  . Hyperlipidemia   . Hypertension   . Thyroid disease   . Cancer 01/2010    Left breast. Partial mastectomy.  . Arthritis    Past Surgical History  Procedure Laterality Date  . Mastectomy, partial  03/22/2010    left   Family History  Problem Relation Age of Onset  . Cancer Mother     bone  . Heart attack Father    History  Substance Use Topics  . Smoking status: Former Smoker -- 10 years    Quit date: 10/21/2007  . Smokeless tobacco: Never Used  . Alcohol Use: No   OB History    No data available     Review of Systems  Constitutional: Positive for activity change, appetite change and fatigue. Negative for fever.  HENT: Negative for congestion and rhinorrhea.   Eyes: Negative for visual disturbance.  Respiratory: Positive for cough and shortness of breath. Negative for chest tightness.    Cardiovascular: Negative for chest pain.  Gastrointestinal: Negative for nausea, vomiting and abdominal pain.  Genitourinary: Negative for dysuria, hematuria, vaginal bleeding and vaginal discharge.  Musculoskeletal: Positive for myalgias and arthralgias. Negative for back pain.  Skin: Negative for rash.  Neurological: Positive for weakness. Negative for dizziness, light-headedness, numbness and headaches.  A complete 10 system review of systems was obtained and all systems are negative except as noted in the HPI and PMH.      Allergies  Review of patient's allergies indicates no known allergies.  Home Medications   Prior to Admission medications   Medication Sig Start Date End Date Taking? Authorizing Provider  acetaminophen (TYLENOL) 500 MG tablet Take 500 mg by mouth every 6 (six) hours as needed for moderate pain.    Yes Historical Provider, MD  amLODipine-benazepril (LOTREL) 5-40 MG per capsule Take 1 capsule by mouth daily. 07/31/14  Yes Mary-Margaret Hassell Done, FNP  fentaNYL (DURAGESIC - DOSED MCG/HR) 12 MCG/HR Place 1 patch (12.5 mcg total) onto the skin every 3 (three) days. 11/29/14  Yes Costin Karlyne Greenspan, MD  levothyroxine (SYNTHROID, LEVOTHROID) 25 MCG tablet Take 1 tablet (25 mcg total) by mouth daily. 07/31/14  Yes Mary-Margaret Hassell Done, FNP  Menthol, Topical Analgesic, (ICY HOT EX) Apply 1 application topically daily as needed (back pain).   Yes Historical Provider, MD  metoprolol tartrate (LOPRESSOR) 25 MG tablet Take 1 tablet (25 mg total) by mouth 2 (two)  times daily. 07/31/14  Yes Mary-Margaret Hassell Done, FNP  Morphine Sulfate (MORPHINE CONCENTRATE) 10 MG/0.5ML SOLN concentrated solution Take 0.5 mLs (10 mg total) by mouth every 3 (three) hours as needed for severe pain. 11/29/14  Yes Costin Karlyne Greenspan, MD  naproxen (NAPROSYN) 375 MG tablet Take 1 tablet (375 mg total) by mouth 2 (two) times daily. Patient not taking: Reported on 11/26/2014 11/24/14   Evalee Jefferson, PA-C   BP 96/58 mmHg   Pulse 101  Temp(Src) 98.3 F (36.8 C) (Oral)  Resp 20  Ht 5\' 3"  (1.6 m)  Wt 120 lb (54.432 kg)  BMI 21.26 kg/m2  SpO2 95% Physical Exam  Constitutional: She is oriented to person, place, and time. She appears well-developed and well-nourished. No distress.  HENT:  Head: Normocephalic and atraumatic.  Mouth/Throat: Oropharynx is clear and moist. No oropharyngeal exudate.  Dry mucus membranes  Eyes: Conjunctivae and EOM are normal. Pupils are equal, round, and reactive to light.  Neck: Normal range of motion. Neck supple.  No meningismus.  Cardiovascular: Normal rate, regular rhythm, normal heart sounds and intact distal pulses.   No murmur heard. Pulmonary/Chest: Effort normal. No respiratory distress.  Diminished breath sounds at bases  Abdominal: Soft. There is no tenderness. There is no rebound and no guarding.  Musculoskeletal: Normal range of motion. She exhibits no edema or tenderness.  Neurological: She is alert and oriented to person, place, and time. No cranial nerve deficit. She exhibits normal muscle tone. Coordination normal.  No ataxia on finger to nose bilaterally. No pronator drift. 5/5 strength throughout. CN 2-12 intact. Equal grip strength. Sensation intact.  Skin: Skin is warm.  Psychiatric: She has a normal mood and affect. Her behavior is normal.  Nursing note and vitals reviewed.   ED Course  Procedures (including critical care time) Labs Review Labs Reviewed  CBC WITH DIFFERENTIAL/PLATELET - Abnormal; Notable for the following:    RBC 3.44 (*)    Hemoglobin 11.0 (*)    HCT 32.7 (*)    RDW 17.0 (*)    Platelets 118 (*)    All other components within normal limits  COMPREHENSIVE METABOLIC PANEL - Abnormal; Notable for the following:    CO2 18 (*)    BUN 34 (*)    Albumin 2.0 (*)    AST 158 (*)    Alkaline Phosphatase 630 (*)    All other components within normal limits  TROPONIN I  BRAIN NATRIURETIC PEPTIDE  URINALYSIS, ROUTINE W REFLEX  MICROSCOPIC (NOT AT [facility])    Imaging Review Dg Chest 2 View  12/08/2014   CLINICAL DATA:  Increasing shortness of breath over the past several days. History of left breast cancer post partial mastectomy.  EXAM: CHEST  2 VIEW  COMPARISON:  11/26/2014 and chest CT 11/26/2014  FINDINGS: Lungs are hypoinflated with mild prominence of the perihilar markings suggesting mild interstitial edema. Evidence of small bilateral pleural effusions left greater than right slightly worse. Cardiomediastinal silhouette and remainder of the exam is unchanged.  IMPRESSION: Small bilateral pleural effusions left greater than right slightly worse. Stable mild interstitial edema.   Electronically Signed   By: Marin Olp M.D.   On: 12/08/2014 21:10     EKG Interpretation   Date/Time:  Monday December 08 2014 20:30:50 EDT Ventricular Rate:  95 PR Interval:  133 QRS Duration: 73 QT Interval:  334 QTC Calculation: 420 R Axis:   47 Text Interpretation:  Sinus rhythm Borderline low voltage, extremity leads  No significant change  was found Confirmed by Wyvonnia Dusky  MD, [place] [phone])  on 12/08/2014 8:44:12 PM      MDM   Final diagnoses:  Pleural effusion  Hypoxia  Metastatic cancer    Generalized weakness with SOB x 3 days.  No fever or chest pain.  Patient with recently diagnosed metastatic cancer of unknown primary. States she is not in hospice.  Dry mucus membranes.  Labs appear to be at baseline.  Stable LFT elevation likely from mets.  Worsening effusion on CXR.  o2 saturation 90% with ambulation, HR to 130s and patient c/o back pain. She appears dry, doubt effusions from CHF. Normal EF On recent echo.   Calcium level normal today. Patient would benefit from supplemental oxygen as it is helping her breathe better.  Suspect malignant effusions. May benefit from thoracentesis though fluid volume appears small.  D/w Dr. Burnadette Pop, MD 12/08/14 [phone]

## 2014-12-08 NOTE — H&P (Signed)
[facility] History and Physical  Raymonde Hamblin YBW:[id number] DOB: Jun 02, 1945    PCP:   Chevis Pretty, FNP   Chief Complaint: shortness of breath.   HPI: Paula Alvarez is an 69 y.o. 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  In the ER, she is comfortable with 2 L [place], providing sat at 94 percent.  Without oxygen, her sat was 89 to 90 percent, and with ambulation, she gets tachycardic and has back pain.  Evaluation showed CXR with mild interstitial edema and mild bilateral pleural effusion,  normal Cr, and Hb of 11.0 grams per dL.  She has no fever, chills, leukocytosis, coughs, or chest pain.   Rewiew of Systems:  Constitutional: Negative for malaise, fever and chills. No significant weight loss or weight gain Eyes: Negative for eye pain, redness and discharge, diplopia, visual changes, or flashes of light. ENMT: Negative for ear pain, hoarseness, nasal congestion, sinus pressure and sore throat. No headaches; tinnitus, drooling, or problem swallowing. Cardiovascular: Negative for chest pain, palpitations, diaphoresis,  and peripheral edema. ; No orthopnea, PND Respiratory: Negative for cough, hemoptysis, wheezing and stridor. No pleuritic chestpain. Gastrointestinal: Negative for nausea, vomiting, diarrhea, constipation, abdominal pain, melena, blood in stool, hematemesis, jaundice and rectal bleeding.    Genitourinary: Negative for frequency, dysuria, incontinence,flank pain and hematuria; Musculoskeletal: Negative for back pain and neck pain. Negative for swelling and trauma.;  Skin: . Negative for pruritus, rash, abrasions, bruising and skin lesion.; ulcerations Neuro: Negative for headache, lightheadedness and neck stiffness. Negative for weakness, altered  level of consciousness , altered mental status, extremity weakness, burning feet, involuntary movement, seizure and syncope.  Psych: negative for anxiety, depression, insomnia, tearfulness, panic attacks, hallucinations, paranoia, suicidal or homicidal ideation    Past Medical History  Diagnosis Date  . Hyperlipidemia   . Hypertension   . Thyroid disease   . Cancer 01/2010    Left breast. Partial mastectomy.  . Arthritis     Past Surgical History  Procedure Laterality Date  . Mastectomy, partial  03/22/2010    left    Medications:  HOME MEDS: Prior to Admission medications   Medication Sig Start Date End Date Taking? Authorizing Provider  acetaminophen (TYLENOL) 500 MG tablet Take 500 mg by mouth every 6 (six) hours as needed for moderate pain.    Yes Historical Provider, MD  amLODipine-benazepril (LOTREL) 5-40 MG per capsule Take 1 capsule by mouth daily. 07/31/14  Yes Mary-Margaret Hassell Done, FNP  fentaNYL (DURAGESIC - DOSED MCG/HR) 12 MCG/HR Place 1 patch (12.5 mcg total) onto the skin every 3 (three) days. 11/29/14  Yes Costin Karlyne Greenspan, MD  levothyroxine (SYNTHROID, LEVOTHROID) 25 MCG tablet Take 1 tablet (25 mcg total) by mouth daily. 07/31/14  Yes Mary-Margaret Hassell Done, FNP  Menthol, Topical Analgesic, (ICY HOT EX) Apply 1 application topically daily as needed (back pain).   Yes Historical Provider, MD  metoprolol tartrate (LOPRESSOR) 25 MG tablet Take 1 tablet (25 mg total) by mouth 2 (two) times daily. 07/31/14  Yes Mary-Margaret Hassell Done, FNP  Morphine Sulfate (MORPHINE CONCENTRATE) 10 MG/0.5ML SOLN concentrated solution Take 0.5 mLs (10 mg total) by mouth every 3 (three) hours as needed for severe pain. 11/29/14  Yes Costin Karlyne Greenspan, MD  naproxen (NAPROSYN) 375 MG tablet Take 1 tablet (  375 mg total) by mouth 2 (two) times daily. Patient not taking: Reported on 11/26/2014 11/24/14   Evalee Jefferson, PA-C     Allergies:  No Known Allergies  Social History:   reports that she quit  smoking about 7 years ago. She has never used smokeless tobacco. She reports that she does not drink alcohol or use illicit drugs.  Family History: Family History  Problem Relation Age of Onset  . Cancer Mother     bone  . Heart attack Father      Physical Exam: Filed Vitals:   12/08/14 2021 12/08/14 2100 12/08/14 2130 12/08/14 2242  BP:  110/57 105/64 96/58  Pulse:  93 95 101  Temp:      TempSrc:      Resp:  17 24 20   Height:      Weight:      SpO2: 100% 99% 98% 95%   Blood pressure 96/58, pulse 101, temperature 98.3 F (36.8 C), temperature source Oral, resp. rate 20, height 5\' 3"  (1.6 m), weight 54.432 kg (120 lb), SpO2 95 %.  GEN:  Pleasant  patient lying in the stretcher in no acute distress; cooperative with exam. PSYCH:  alert and oriented x4; does not appear anxious or depressed; affect is appropriate. HEENT: Mucous membranes pink and anicteric; PERRLA; EOM intact; no cervical lymphadenopathy nor thyromegaly or carotid bruit; no JVD; There were no stridor. Neck is very supple. Breasts:: Not examined CHEST WALL: No tenderness CHEST: Normal respiration, clear to auscultation bilaterally.  HEART: Regular rate and rhythm.  There are no murmur, rub, or gallops.   BACK: No kyphosis or scoliosis; no CVA tenderness ABDOMEN: soft and non-tender; no masses, no organomegaly, normal abdominal bowel sounds; no pannus; no intertriginous candida. There is no rebound and no distention. Rectal Exam: Not done EXTREMITIES: No bone or joint deformity; age-appropriate arthropathy of the hands and knees; no edema; no ulcerations.  There is no calf tenderness. Genitalia: not examined PULSES: 2+ and symmetric SKIN: Normal hydration no rash or ulceration CNS: Cranial nerves 2-12 grossly intact no focal lateralizing neurologic deficit.  Speech is fluent; uvula elevated with phonation, facial symmetry and tongue midline. DTR are normal bilaterally, cerebella exam is intact, barbinski is negative  and strengths are equaled bilaterally.  No sensory loss.   Labs on Admission:  Basic Metabolic Panel:  Recent Labs Lab 12/08/14 2013  NA 137  K 4.6  CL 104  CO2 18*  GLUCOSE 83  BUN 34*  CREATININE 0.87  CALCIUM 9.2   Liver Function Tests:  Recent Labs Lab 12/08/14 2013  AST 158*  ALT 30  ALKPHOS 630*  BILITOT 0.7  PROT 7.2  ALBUMIN 2.0*   No results for input(s): LIPASE, AMYLASE in the last 168 hours. No results for input(s): AMMONIA in the last 168 hours. CBC:  Recent Labs Lab 12/08/14 2013  WBC 7.8  NEUTROABS 6.1  HGB 11.0*  HCT 32.7*  MCV 95.1  PLT 118*   Cardiac Enzymes:  Recent Labs Lab 12/08/14 2013  TROPONINI 0.03    CBG: No results for input(s): GLUCAP in the last 168 hours.   Radiological Exams on Admission: Dg Chest 2 View  12/08/2014   CLINICAL DATA:  Increasing shortness of breath over the past several days. History of left breast cancer post partial mastectomy.  EXAM: CHEST  2 VIEW  COMPARISON:  11/26/2014 and chest CT 11/26/2014  FINDINGS: Lungs are hypoinflated with mild prominence of the perihilar markings suggesting mild interstitial edema.  Evidence of small bilateral pleural effusions left greater than right slightly worse. Cardiomediastinal silhouette and remainder of the exam is unchanged.  IMPRESSION: Small bilateral pleural effusions left greater than right slightly worse. Stable mild interstitial edema.   Electronically Signed   By: Marin Olp M.D.   On: 12/08/2014 21:10    EKG: Independently reviewed.    Assessment/Plan Present on Admission:  . Breast cancer, left breast . Hypercalcemia . Hypothyroidism . Hypoxia  PLAN:  I will admit her OBS, so we can provide supplemented oxygen for her comfort.  She has diffuse metastatic breast CA, and had been under hospice care before, and I think she would benefit having hospice care at home.  She doesn't appears to be SOB with 2 L [place], and doesn't appear to be having any pain,  except when she tries to ambulate, which probably explain her tachycardia.  She doesn't need IVF, so I will stop it, and give her one dose of Lasix at 20mg  IV.  She is stable, and will be admitted to [facility].    Other plans as per orders.  Code Status: Oda Kilts, MD. [facility] Pager [phone] 7pm to 7am.  12/08/2014, 10:50 PM

## 2014-12-08 NOTE — ED Notes (Signed)
Patient via [facility] c/o weakness, shortness of breath, and dehydration. Patient has a history of Breast Cancer with METS to liver, lungs, bones. Patient on 2l/m O2 97% per ems.

## 2014-12-09 DIAGNOSIS — E872 Acidosis, unspecified: Secondary | ICD-10-CM | POA: Diagnosis present

## 2014-12-09 DIAGNOSIS — J810 Acute pulmonary edema: Secondary | ICD-10-CM

## 2014-12-09 DIAGNOSIS — C801 Malignant (primary) neoplasm, unspecified: Secondary | ICD-10-CM | POA: Diagnosis not present

## 2014-12-09 DIAGNOSIS — D696 Thrombocytopenia, unspecified: Secondary | ICD-10-CM

## 2014-12-09 DIAGNOSIS — J811 Chronic pulmonary edema: Secondary | ICD-10-CM | POA: Diagnosis present

## 2014-12-09 LAB — URINALYSIS, ROUTINE W REFLEX MICROSCOPIC
GLUCOSE, UA: NEGATIVE mg/dL
HGB URINE DIPSTICK: NEGATIVE
LEUKOCYTES UA: NEGATIVE
NITRITE: NEGATIVE
PROTEIN: NEGATIVE mg/dL
SPECIFIC GRAVITY, URINE: 1.02 (ref 1.005–1.030)
Urobilinogen, UA: 0.2 mg/dL (ref 0.0–1.0)
pH: 5.5 (ref 5.0–8.0)

## 2014-12-09 MED ORDER — FUROSEMIDE 20 MG PO TABS: 20.0000 mg | ORAL_TABLET | Freq: Every day | ORAL | Status: DC | PRN

## 2014-12-09 MED ORDER — METOPROLOL TARTRATE 25 MG PO TABS: ORAL_TABLET | ORAL | Status: DC

## 2014-12-09 MED ORDER — FUROSEMIDE 20 MG PO TABS: 20.0000 mg | ORAL_TABLET | Freq: Every day | ORAL | Status: AC | PRN

## 2014-12-09 MED ORDER — POTASSIUM CHLORIDE CRYS ER 10 MEQ PO TBCR
10.0000 meq | EXTENDED_RELEASE_TABLET | Freq: Every day | ORAL | Status: AC
  Administered 2014-12-09: 10 meq via ORAL
  Filled 2014-12-09: qty 1

## 2014-12-09 MED ORDER — FUROSEMIDE 20 MG PO TABS
10.0000 mg | ORAL_TABLET | Freq: Every day | ORAL | Status: DC
  Administered 2014-12-09: 10 mg via ORAL
  Filled 2014-12-09: qty 1

## 2014-12-09 MED ORDER — AMLODIPINE BESY-BENAZEPRIL HCL 5-40 MG PO CAPS: ORAL_CAPSULE | ORAL | Status: AC

## 2014-12-09 MED ORDER — POTASSIUM CHLORIDE ER 10 MEQ PO TBCR: 10.0000 meq | EXTENDED_RELEASE_TABLET | Freq: Every day | ORAL | Status: AC | PRN

## 2014-12-09 MED ORDER — METOPROLOL TARTRATE 25 MG PO TABS: ORAL_TABLET | ORAL | Status: AC

## 2014-12-09 NOTE — Progress Notes (Signed)
Notified by lab that despite multiple attempts, they were unable to draw blood for CMP and BNP. Notified MD that CMP order could be changed to BMP and fingerstick could be run for results. MD gave verbal order to cancel both and that arrangement for hospice/palliative would be arranged. Lab notified of cancellation.

## 2014-12-09 NOTE — Progress Notes (Signed)
NURSING PROGRESS NOTE  Maela Takeda [id number] Discharge Data: 12/09/2014 4:00 PM Attending Provider: No att. providers found BTD:[id number],AGTX Joycelyn Schmid, FNP   Reita Cliche to be D/C'd Home per MD order.    All IV's discontinued and monitored for bleeding.  All belongings returned to patient for patient to take home.  Cardiac monitor discontinued.  AVS summary and prescriptions reviewed with patient and daughter.  Patient left floor via wheelchair, escorted by NT.  Last Documented Vital Signs:  Blood pressure 100/51, pulse 96, temperature 98.3 F (36.8 C), temperature source Oral, resp. rate 16, height 5\' 3"  (1.6 m), weight 54.023 kg (119 lb 1.6 oz), SpO2 98 %.  Cecilie Kicks D

## 2014-12-09 NOTE — Discharge Summary (Signed)
Physician Discharge Summary  Paula Alvarez FBP:[id number] DOB: 09-01-1945 DOA: 12/08/2014  PCP: Chevis Pretty, FNP  Admit date: 12/08/2014 Discharge date: 12/09/2014  Time spent: Greater than 30 minutes  Recommendations for Outpatient Follow-up:  1. Patient is being discharged to home with hospice services.   Discharge Diagnoses:   1. Patient was discharged on hospice.  2. End-stage metastatic breast cancer.   3. Mild pulmonary edema and small bilateral pleural effusions, likely secondary to metastatic breast cancer. Patient was not evaluated for congestive heart failure secondary to her hospice status. 4. Mild metabolic acidosis, likely secondary to mild dehydration. 5. Thrombocytopenia, likely secondary to malignancy. 6. Elevated SGOT, secondary to hepatic metastasis. 7. History of hypertension with low-normal blood pressures during hospitalization. 8. Hypothyroidism.   Discharge Condition: Stable but with poor prognosis due to metastatic breast cancer.  Diet recommendation: regular as tolerated.  Filed Weights   12/08/14 1952 12/08/14 2341  Weight: 54.432 kg (120 lb) 54.023 kg (119 lb 1.6 oz)    History of present illness:  The patient is a 69 year old woman with a history of diffuse breast cancer with metastasis to the liver and bone, who was admitted to the emergency department on 12/08/2014 for shortness of breath. In the ED, she was placed on nasal cannula oxygen providing her with an oxygen saturation of 94%. Her chest x-ray revealed mild interstitial edema and mild bilateral pleural effusions. Her creatinine was within normal limits. Her hemoglobin and platelet count were low at 11 and 118 respectively. Her CO2 was slightly low at 18. Her creatinine was within normal limits. Her AST was mildly elevated at 158, but her other LFTs were within normal limits. She was admitted for further management.  Hospital Course:  The patient was given 20 g of IV Lasix in the  emergency department. She urinated quite a bit and felt symptomatically improved. She was restarted on all of her chronic medications, but her antihypertensives medications were withheld the following morning because of low-normal blood pressures. She had been recently discharged to hospice care at home, but apparently, when the hospice nurse arrived before, the patient asked her to leave. With symptomatic improvement, the patient wanted to go home. She was receptive to reconsulting hospice as she was more accepting of her terminal illness. Therefore, hospice was re-consulted. They met with her on the phone. Arrangements for hospice services were placed with oxygen and other services they provided for the patient's comfort and management.  Further lab evaluation was ordered, but the phlebotomist had difficulty obtaining a blood specimen. The patient did not want to be stuck again which was reasonable, and therefore, further laboratory studies were discontinued. She was noted to have mild metabolic acidosis, mild anemia, and mild thrombocytopenia. Her chest x-ray revealed pulmonary edema and bilateral pleural effusions, likely from her metastatic disease. A 2-D echocardiogram was not ordered to evaluate for CHF given her terminal illness and hospice status.  She was discharged with when necessary Lasix and potassium chloride. She was instructed not to take her blood pressure medications if her systolic blood pressure was less than 140.   Procedures:  None   Consultations:  None  Discharge Exam: Filed Vitals:   12/09/14 1255  BP: 100/51  Pulse: 96  Temp: 98.3 F (36.8 C)  Resp: 16   oxygen saturation 98% on supplemental oxygen.   General: ill appearing 69 year old African-American woman in no acute distress. Cardiovascular: S1, S2, no murmurs rubs or gallops. Respiratory:  clear to auscultation bilaterally. Breathing nonlabored.  Discharge Instructions   Discharge Instructions     Diet general    Complete by:  As directed      Increase activity slowly    Complete by:  As directed           Current Discharge Medication List    START taking these medications   Details  furosemide (LASIX) 20 MG tablet Take 1 tablet (20 mg total) by mouth daily as needed for fluid or edema. Qty: 30 tablet, Refills: 3    potassium chloride (K-DUR) 10 MEQ tablet Take 1 tablet (10 mEq total) by mouth daily as needed. Take this potassium tablet only when you take Lasix (furosemide) Qty: 30 tablet, Refills: 0      CONTINUE these medications which have CHANGED   Details  amLODipine-benazepril (LOTREL) 5-40 MG per capsule Take this blood pressure medicine daily only if you Systolic Blood Pressure (top number) is over 140; otherwise do not take it.   Associated Diagnoses: Essential hypertension    metoprolol tartrate (LOPRESSOR) 25 MG tablet Take this blood pressure medicine daily only if you Systolic Blood Pressure (top number) is over 140; otherwise do not take it.   Associated Diagnoses: Essential hypertension      CONTINUE these medications which have NOT CHANGED   Details  acetaminophen (TYLENOL) 500 MG tablet Take 500 mg by mouth every 6 (six) hours as needed for moderate pain.     fentaNYL (DURAGESIC - DOSED MCG/HR) 12 MCG/HR Place 1 patch (12.5 mcg total) onto the skin every 3 (three) days. Qty: 5 patch, Refills: 0    levothyroxine (SYNTHROID, LEVOTHROID) 25 MCG tablet Take 1 tablet (25 mcg total) by mouth daily. Qty: 90 tablet, Refills: 1   Associated Diagnoses: Other specified hypothyroidism    Menthol, Topical Analgesic, (ICY HOT EX) Apply 1 application topically daily as needed (back pain).      STOP taking these medications     Morphine Sulfate (MORPHINE CONCENTRATE) 10 MG/0.5ML SOLN concentrated solution      naproxen (NAPROSYN) 375 MG tablet        No Known Allergies Follow-up Information    Follow up with Chevis Pretty, FNP.   Specialty:  Nurse  Practitioner   Why:  As needed   Contact information:   [address] [place] [id number] [phone]        The results of significant diagnostics from this hospitalization (including imaging, microbiology, ancillary and laboratory) are listed below for reference.    Significant Diagnostic Studies: Ct Abdomen Pelvis Wo Contrast  11/27/2014   CLINICAL DATA:  Shortness of breath and altered mental status with elevated liver enzymes. History of left breast cancer. No IV contrast due to elevated creatinine.  EXAM: CT CHEST, ABDOMEN AND PELVIS WITHOUT CONTRAST  TECHNIQUE: Multidetector CT imaging of the chest, abdomen and pelvis was performed following the standard protocol without IV contrast.  COMPARISON:  None.  FINDINGS: CT CHEST FINDINGS  Normal heart size. Normal caliber thoracic aorta. Aortic calcifications. Scattered lymph nodes demonstrated in the mediastinum are not abnormally enlarged. Borderline prominence of lymph nodes in the right axilla, measuring up to 7 mm short axis dimension. These are likely to be reactive. Postoperative changes with left mastectomy and surgical clips in the left axilla. Esophagus is decompressed.  Evaluation of lungs is limited due to severe respiratory motion artifact. There are moderate size bilateral pleural effusions with atelectasis in both lung bases. Circumscribed nodular opacity in the right middle lung measuring 4 mm diameter.  Six-month follow-up study suggested. No pneumothorax.  CT ABDOMEN AND PELVIS FINDINGS  The liver is diffusely enlarged with nodular contour and multiple vague low-attenuation changes throughout the liver. This is likely due to metastatic disease. If the patient cannot have IV contrast material, referral for elective MRI may be useful. The unenhanced appearance of the gallbladder, pancreas, spleen, inferior vena cava are unremarkable. 2.4 cm lesion in the upper pole left kidney with low-attenuation suggesting a cyst. No  hydronephrosis in either kidney. Calcification of abdominal aorta without aneurysm. No adrenal gland nodules. Mild prominence of periaortic retroperitoneal lymph nodes at 7 mm. These are indeterminate but early metastasis cannot be excluded. Stomach and small bowel are decompressed. Colon is not abnormally distended. No free air or free fluid in the abdomen.  Pelvis: Diverticulosis of the sigmoid colon. No evidence of diverticulitis. Bladder wall is not thickened. Due to should ovaries are not enlarged. Appendix is normal. No free or loculated pelvic fluid collections.  Bones: Diffuse heterogeneous sclerotic and lytic changes throughout the thoracic, lumbar, and sacral spine as well as in the sternum and pelvis and within multiple bilateral ribs consistent with diffuse bone metastasis.  IMPRESSION: Moderate bilateral pleural effusions with bilateral basilar atelectasis. Borderline lymph nodes in the mediastinum and right axilla are indeterminate. 4 mm nodule in the right middle lung.  Diffuse metastases throughout the liver.  Diffuse bone metastases.   Electronically Signed   By: Lucienne Capers M.D.   On: 11/27/2014 00:59   Dg Chest 2 View  12/08/2014   CLINICAL DATA:  Increasing shortness of breath over the past several days. History of left breast cancer post partial mastectomy.  EXAM: CHEST  2 VIEW  COMPARISON:  11/26/2014 and chest CT 11/26/2014  FINDINGS: Lungs are hypoinflated with mild prominence of the perihilar markings suggesting mild interstitial edema. Evidence of small bilateral pleural effusions left greater than right slightly worse. Cardiomediastinal silhouette and remainder of the exam is unchanged.  IMPRESSION: Small bilateral pleural effusions left greater than right slightly worse. Stable mild interstitial edema.   Electronically Signed   By: Marin Olp M.D.   On: 12/08/2014 21:10   Dg Chest 2 View  11/26/2014   CLINICAL DATA:  Shortness of breath and cough  EXAM: CHEST - 2 VIEW   COMPARISON:  03/10/2010  FINDINGS: Cardiac shadow is stable. Mild vascular congestion with interstitial edema is seen. Bilateral small effusions are seen with likely underlying atelectasis. No bony abnormality is noted. Postsurgical changes in the left axilla are seen.  IMPRESSION: Changes consistent with mild CHF.   Electronically Signed   By: Inez Catalina M.D.   On: 11/26/2014 21:37   Ct Head Wo Contrast  11/26/2014   CLINICAL DATA:  Initial evaluation for slurred speech, shortness of breath.  EXAM: CT HEAD WITHOUT CONTRAST  TECHNIQUE: Contiguous axial images were obtained from the base of the skull through the vertex without intravenous contrast.  COMPARISON:  None.  FINDINGS: There is no acute intracranial hemorrhage or infarct. No mass lesion or midline shift. Gray-white matter differentiation is well maintained. Ventricles are normal in size without evidence of hydrocephalus. CSF containing spaces are within normal limits. No extra-axial fluid collection. Mild atrophy with chronic microvascular ischemic disease. Vascular calcifications present within the carotid siphons.  The calvarium is intact.  Orbital soft tissues are within normal limits.  The paranasal sinuses and mastoid air cells are well pneumatized and free of fluid.  Scalp soft tissues are unremarkable.  IMPRESSION: 1. No acute  intracranial process. 2. Mild atrophy with chronic microvascular ischemic disease. 3. Intracranial atherosclerosis.   Electronically Signed   By: Jeannine Boga M.D.   On: 11/26/2014 22:37   Ct Chest Wo Contrast  11/27/2014   CLINICAL DATA:  Shortness of breath and altered mental status with elevated liver enzymes. History of left breast cancer. No IV contrast due to elevated creatinine.  EXAM: CT CHEST, ABDOMEN AND PELVIS WITHOUT CONTRAST  TECHNIQUE: Multidetector CT imaging of the chest, abdomen and pelvis was performed following the standard protocol without IV contrast.  COMPARISON:  None.  FINDINGS: CT CHEST  FINDINGS  Normal heart size. Normal caliber thoracic aorta. Aortic calcifications. Scattered lymph nodes demonstrated in the mediastinum are not abnormally enlarged. Borderline prominence of lymph nodes in the right axilla, measuring up to 7 mm short axis dimension. These are likely to be reactive. Postoperative changes with left mastectomy and surgical clips in the left axilla. Esophagus is decompressed.  Evaluation of lungs is limited due to severe respiratory motion artifact. There are moderate size bilateral pleural effusions with atelectasis in both lung bases. Circumscribed nodular opacity in the right middle lung measuring 4 mm diameter. Six-month follow-up study suggested. No pneumothorax.  CT ABDOMEN AND PELVIS FINDINGS  The liver is diffusely enlarged with nodular contour and multiple vague low-attenuation changes throughout the liver. This is likely due to metastatic disease. If the patient cannot have IV contrast material, referral for elective MRI may be useful. The unenhanced appearance of the gallbladder, pancreas, spleen, inferior vena cava are unremarkable. 2.4 cm lesion in the upper pole left kidney with low-attenuation suggesting a cyst. No hydronephrosis in either kidney. Calcification of abdominal aorta without aneurysm. No adrenal gland nodules. Mild prominence of periaortic retroperitoneal lymph nodes at 7 mm. These are indeterminate but early metastasis cannot be excluded. Stomach and small bowel are decompressed. Colon is not abnormally distended. No free air or free fluid in the abdomen.  Pelvis: Diverticulosis of the sigmoid colon. No evidence of diverticulitis. Bladder wall is not thickened. Due to should ovaries are not enlarged. Appendix is normal. No free or loculated pelvic fluid collections.  Bones: Diffuse heterogeneous sclerotic and lytic changes throughout the thoracic, lumbar, and sacral spine as well as in the sternum and pelvis and within multiple bilateral ribs consistent with  diffuse bone metastasis.  IMPRESSION: Moderate bilateral pleural effusions with bilateral basilar atelectasis. Borderline lymph nodes in the mediastinum and right axilla are indeterminate. 4 mm nodule in the right middle lung.  Diffuse metastases throughout the liver.  Diffuse bone metastases.   Electronically Signed   By: Lucienne Capers M.D.   On: 11/27/2014 00:59    Microbiology: No results found for this or any previous visit (from the past 240 hour(s)).   Labs: Basic Metabolic Panel:  Recent Labs Lab 12/08/14 2013  NA 137  K 4.6  CL 104  CO2 18*  GLUCOSE 83  BUN 34*  CREATININE 0.87  CALCIUM 9.2   Liver Function Tests:  Recent Labs Lab 12/08/14 2013  AST 158*  ALT 30  ALKPHOS 630*  BILITOT 0.7  PROT 7.2  ALBUMIN 2.0*   No results for input(s): LIPASE, AMYLASE in the last 168 hours. No results for input(s): AMMONIA in the last 168 hours. CBC:  Recent Labs Lab 12/08/14 2013  WBC 7.8  NEUTROABS 6.1  HGB 11.0*  HCT 32.7*  MCV 95.1  PLT 118*   Cardiac Enzymes:  Recent Labs Lab 12/08/14 2013  TROPONINI 0.03  BNP: BNP (last 3 results)  Recent Labs  11/26/14 2030 12/08/14 2013  BNP 44.0 71.0    ProBNP (last 3 results) No results for input(s): PROBNP in the last 8760 hours.  CBG: No results for input(s): GLUCAP in the last 168 hours.     Signed:  Alin Chavira  [facility] 12/09/2014, 1:50 PM

## 2014-12-09 NOTE — Care Management Note (Signed)
Case Management Note  Patient Details  Name: Paula Alvarez MRN: [id number] Date of Birth: 07/03/1945  Subjective/Objective:                  Pt admitted from home with SOB. Pt lives at home and pts daughter has been with pt since last hospitalization. Pt turned hospice away at home but is now requesting hospice of [facility].   Action/Plan: Referral called and faxed to [facility]. Hospice will arrange home O2 prior to discharge and portable will be delivered to pts room. Pt for discharge today. Hospice will admit pt at home today or tomorrow.  Expected Discharge Date:   12/09/14               Expected Discharge Plan:  Home w Hospice Care  In-House Referral:  NA  Discharge planning Services  CM Consult  Post Acute Care Choice:  Hospice Choice offered to:  Patient  DME Arranged:    DME Agency:     HH Arranged:    [place] Agency:     Status of Service:  Completed, signed off  Medicare Important Message Given:    Date Medicare IM Given:    Medicare IM give by:    Date Additional Medicare IM Given:    Additional Medicare Important Message give by:     If discussed at [place] of Stay Meetings, dates discussed:    Additional Comments:  Joylene Draft, RN 12/09/2014, 10:59 AM

## 2014-12-10 ENCOUNTER — Telehealth: Payer: Self-pay | Admitting: *Deleted

## 2014-12-10 NOTE — Telephone Encounter (Signed)
They would like orders for Prednisone 20 mg one po qd for appetite, and some mouthwash for sores sent to [facility], that is the only was hospice will pay for them

## 2014-12-11 ENCOUNTER — Other Ambulatory Visit: Payer: Self-pay | Admitting: Nurse Practitioner

## 2014-12-11 MED ORDER — MAGIC MOUTHWASH W/LIDOCAINE: 5.0000 mL | Freq: Three times a day (TID) | ORAL | Status: DC | PRN

## 2014-12-11 MED ORDER — MAGIC MOUTHWASH W/LIDOCAINE: 5.0000 mL | Freq: Three times a day (TID) | ORAL | Status: AC | PRN

## 2014-12-11 MED ORDER — PREDNISONE 20 MG PO TABS: 20.0000 mg | ORAL_TABLET | Freq: Every day | ORAL | Status: AC

## 2014-12-11 MED ORDER — PREDNISONE 20 MG PO TABS: 20.0000 mg | ORAL_TABLET | Freq: Every day | ORAL | Status: DC

## 2015-01-15 DEATH — deceased

## 2015-02-26 ENCOUNTER — Other Ambulatory Visit: Payer: Self-pay | Admitting: Nurse Practitioner

## 2016-02-14 IMAGING — DX DG CHEST 2V
2 series · 2 of 2 positions shown · non-contrast
Comparison: 03/10/2010

CLINICAL DATA: Shortness of breath and cough

EXAM:
CHEST - 2 VIEW

[chest lat]
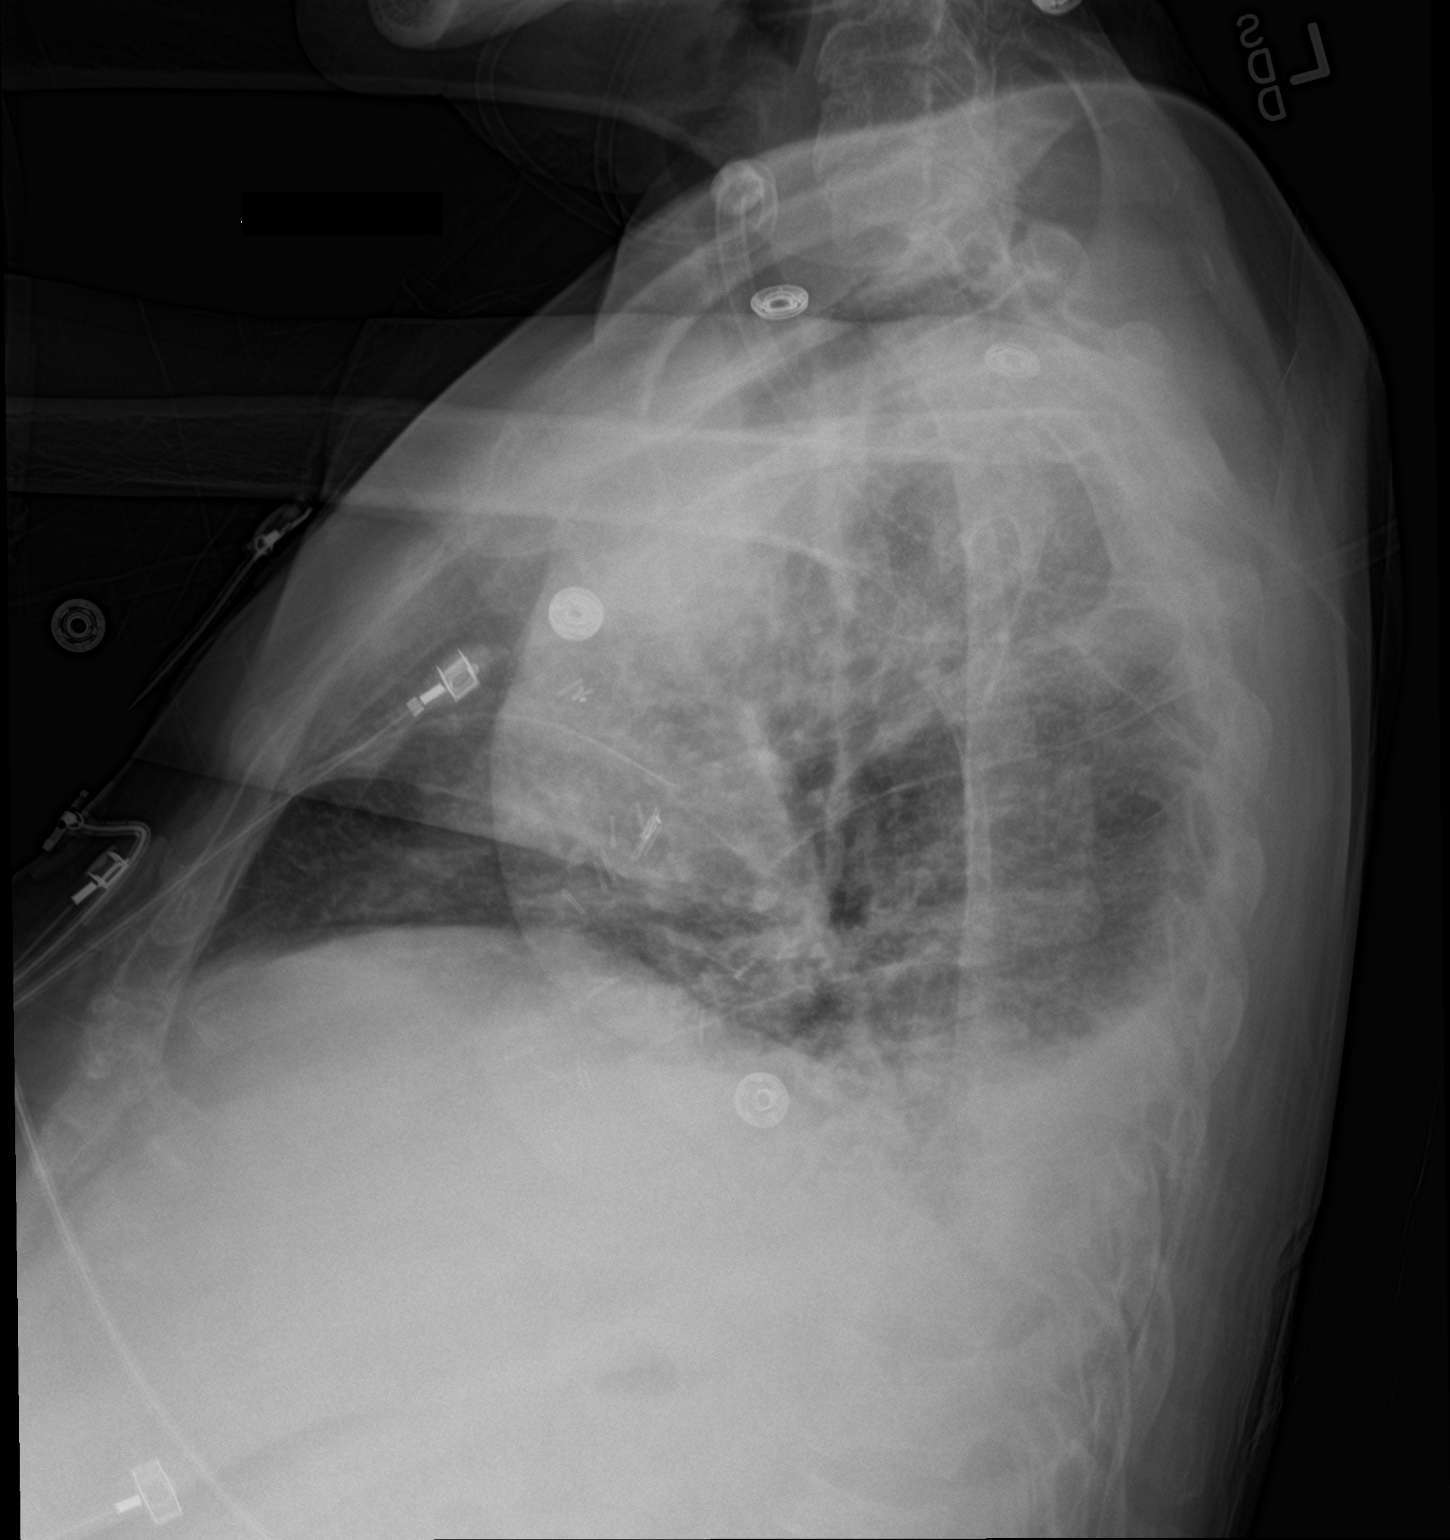

[chest ap]
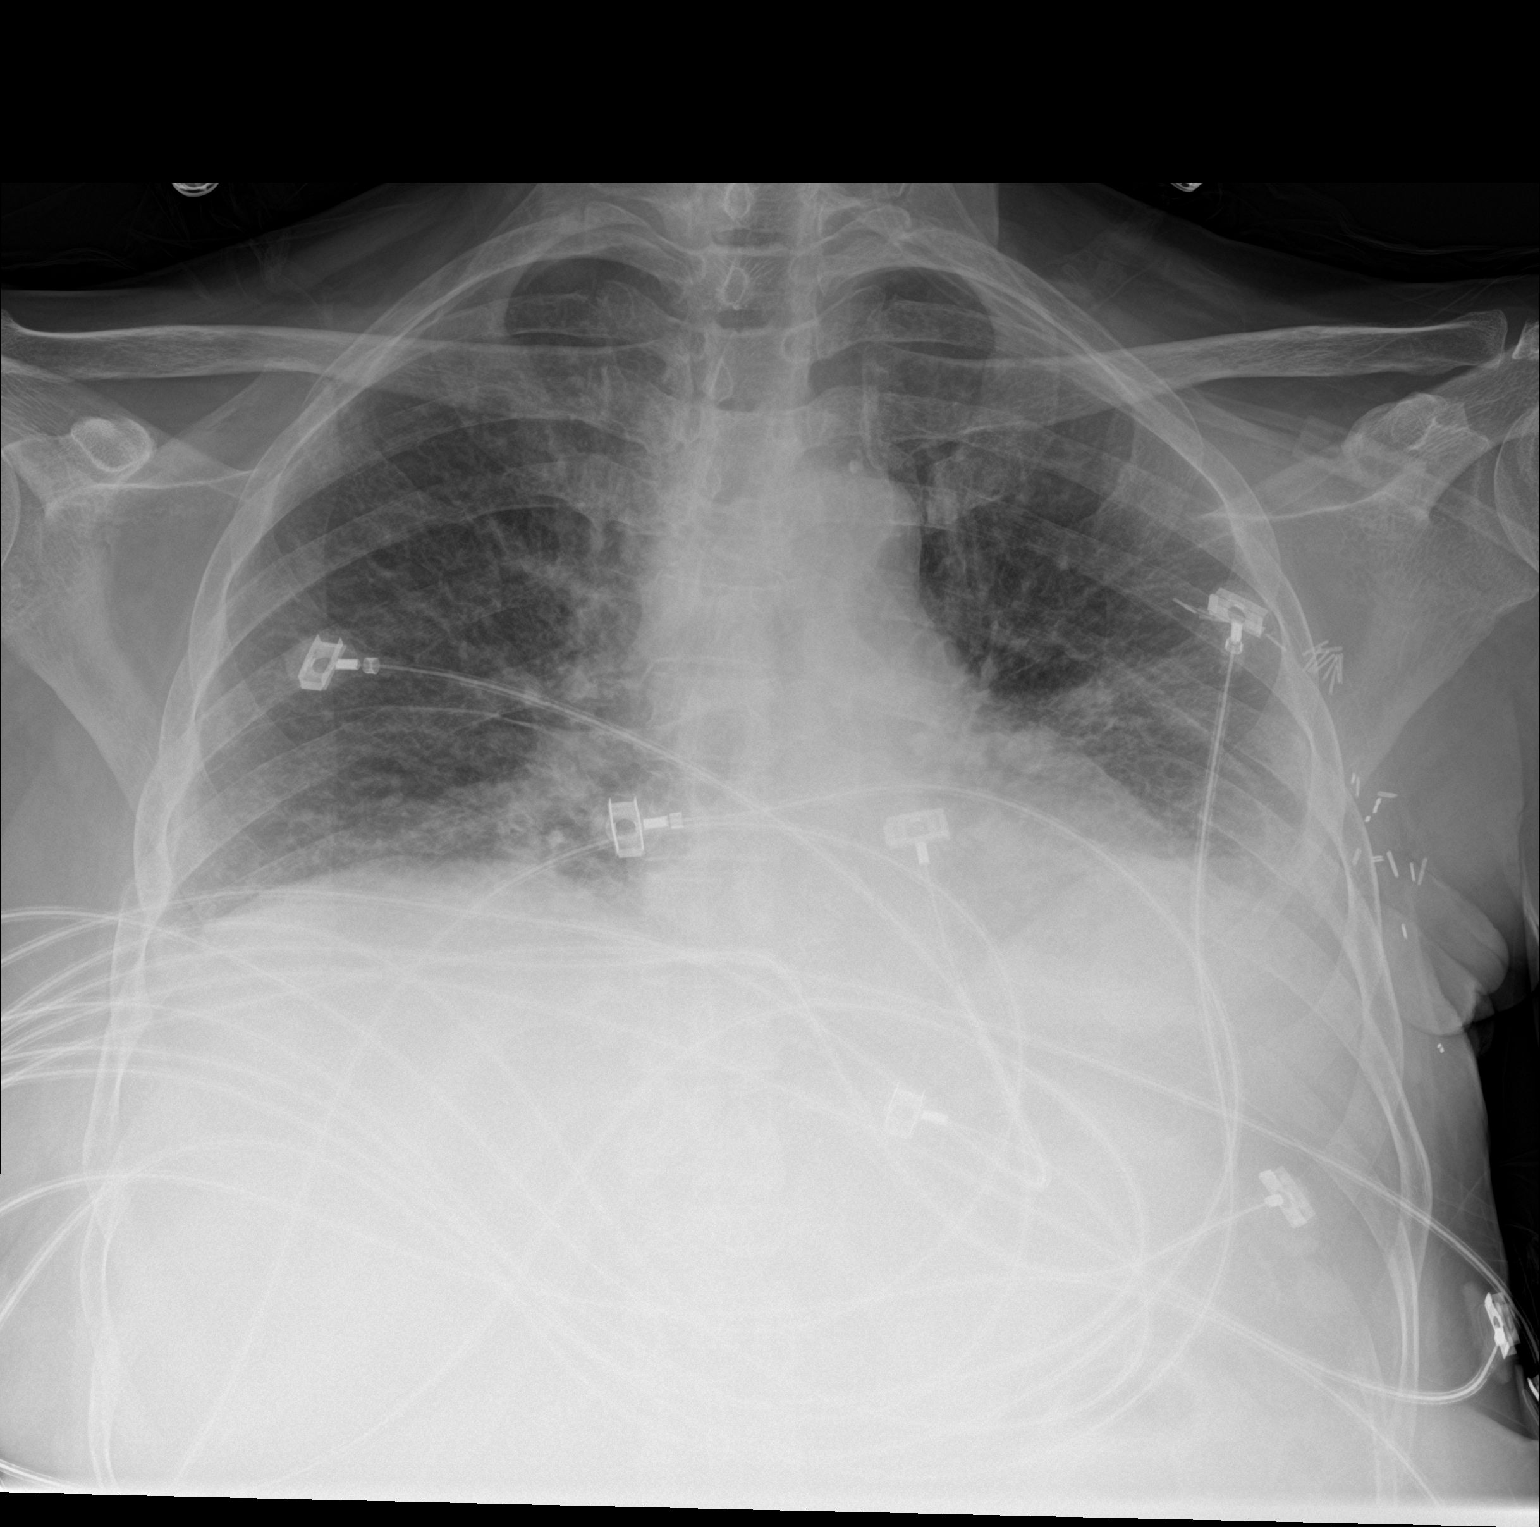

[2 of 2 positions shown; findings below may reference images not displayed]

FINDINGS: Cardiac shadow is stable. Mild vascular congestion with interstitial
edema is seen. Bilateral small effusions are seen with likely
underlying atelectasis. No bony abnormality is noted. Postsurgical
changes in the left axilla are seen.
IMPRESSION: Changes consistent with mild CHF.

## 2016-02-26 IMAGING — DX DG CHEST 2V
2 series · 2 of 2 positions shown · non-contrast
Comparison: 11/26/2014 and chest CT 11/26/2014

CLINICAL DATA: Increasing shortness of breath over the past several
days. History of left breast cancer post partial mastectomy.

EXAM:
CHEST  2 VIEW

[chest lat]
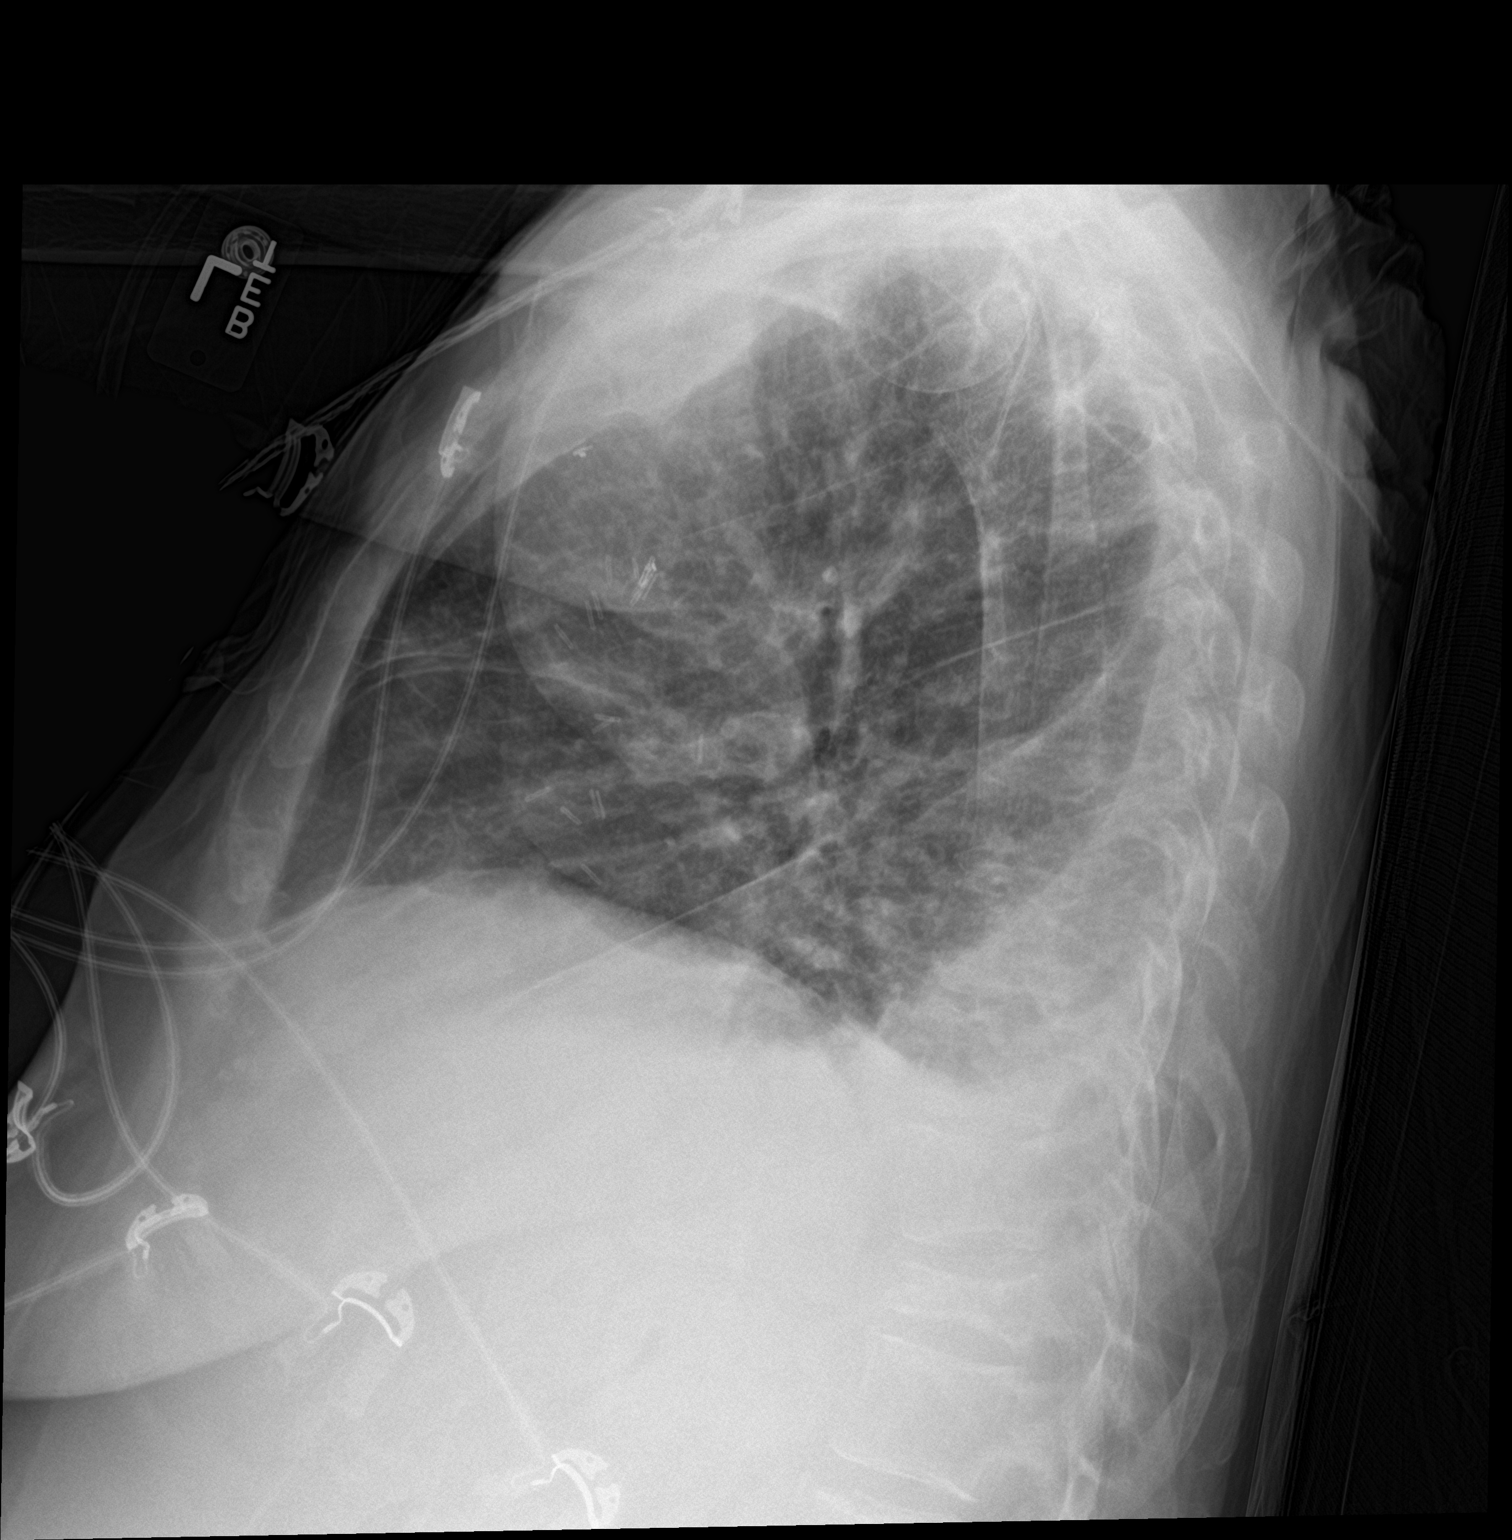

[chest ap strecther]
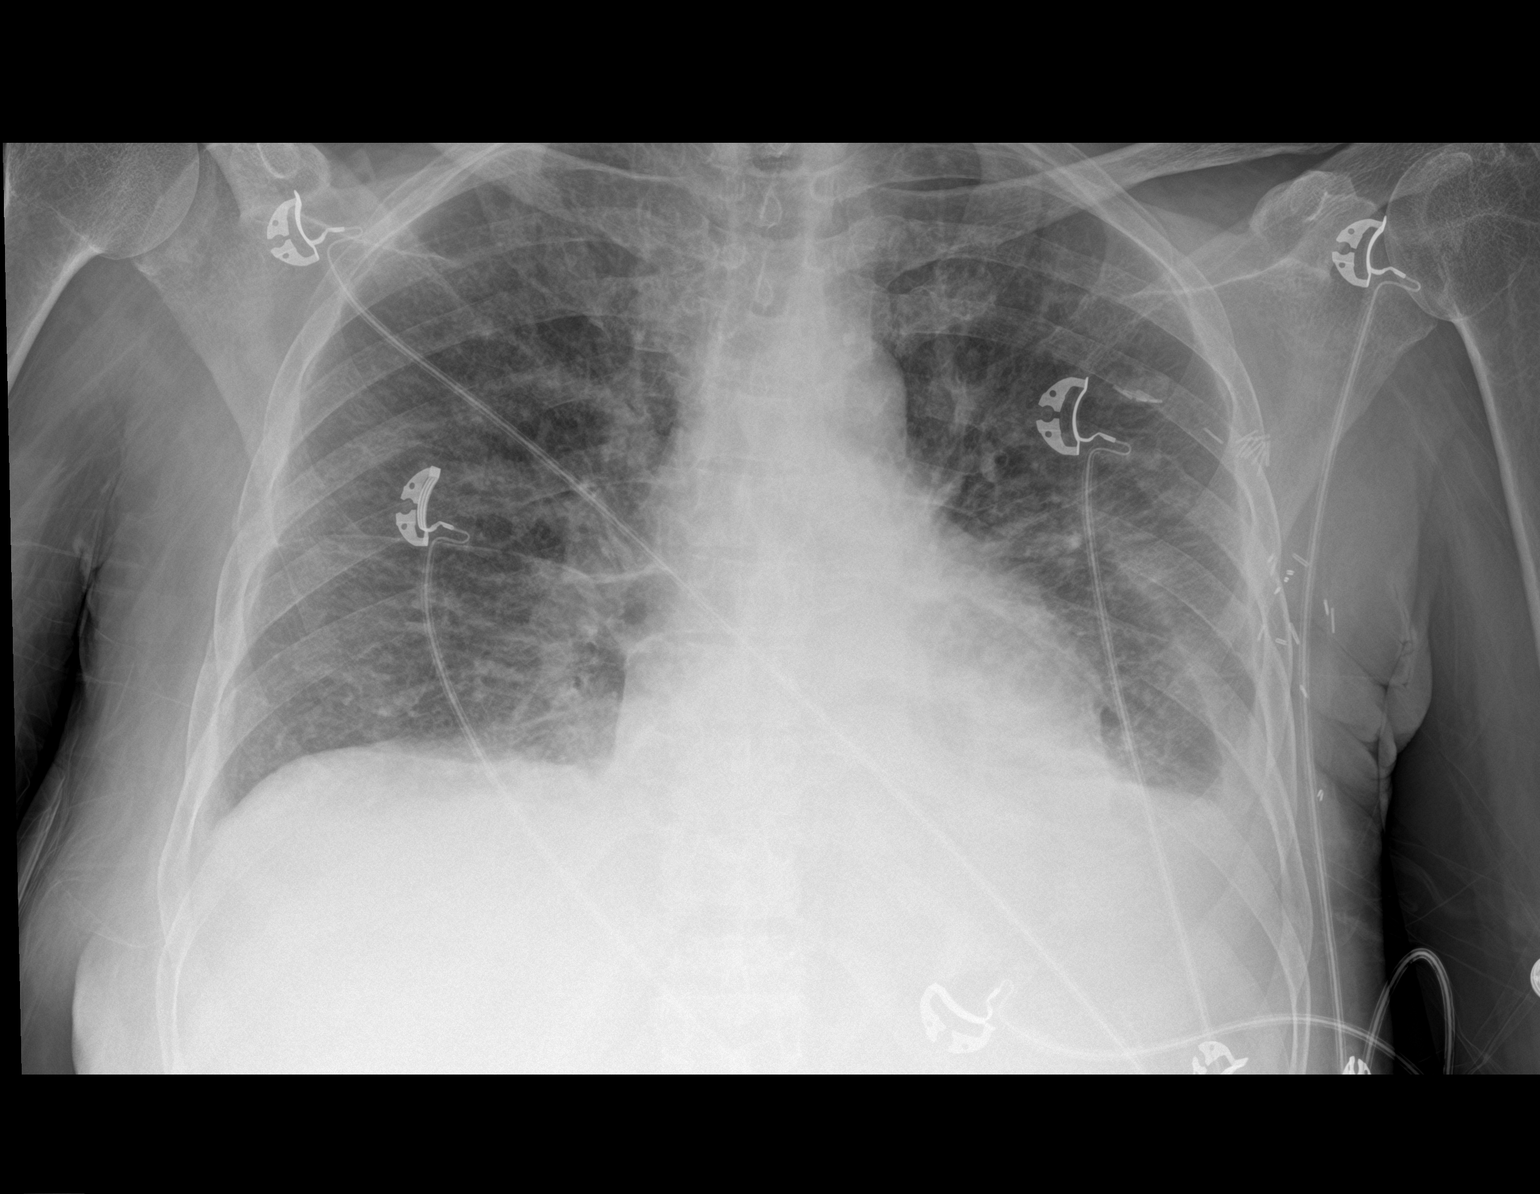

[2 of 2 positions shown; findings below may reference images not displayed]

FINDINGS: Lungs are hypoinflated with mild prominence of the perihilar
markings suggesting mild interstitial edema. Evidence of small
bilateral pleural effusions left greater than right slightly worse.
Cardiomediastinal silhouette and remainder of the exam is unchanged.
IMPRESSION: Small bilateral pleural effusions left greater than right slightly
worse. Stable mild interstitial edema.
